# Patient Record
Sex: Male | Born: 1945 | Race: White | Hispanic: No | Marital: Married | Smoking: Former smoker
Health system: Southern US, Community
[De-identification: ages and names within clinical notes are randomized; demographics above are authoritative.]

## PROBLEM LIST (undated history)

## (undated) DIAGNOSIS — Z87442 Personal history of urinary calculi: Secondary | ICD-10-CM

## (undated) DIAGNOSIS — K589 Irritable bowel syndrome without diarrhea: Secondary | ICD-10-CM

## (undated) DIAGNOSIS — J310 Chronic rhinitis: Secondary | ICD-10-CM

## (undated) DIAGNOSIS — E785 Hyperlipidemia, unspecified: Secondary | ICD-10-CM

## (undated) DIAGNOSIS — J189 Pneumonia, unspecified organism: Secondary | ICD-10-CM

## (undated) DIAGNOSIS — I739 Peripheral vascular disease, unspecified: Secondary | ICD-10-CM

## (undated) DIAGNOSIS — N529 Male erectile dysfunction, unspecified: Secondary | ICD-10-CM

## (undated) DIAGNOSIS — C801 Malignant (primary) neoplasm, unspecified: Secondary | ICD-10-CM

## (undated) DIAGNOSIS — I10 Essential (primary) hypertension: Secondary | ICD-10-CM

## (undated) DIAGNOSIS — K219 Gastro-esophageal reflux disease without esophagitis: Secondary | ICD-10-CM

## (undated) HISTORY — DX: Chronic rhinitis: J31.0

## (undated) HISTORY — DX: Essential (primary) hypertension: I10

## (undated) HISTORY — DX: Male erectile dysfunction, unspecified: N52.9

## (undated) HISTORY — PX: OTHER SURGICAL HISTORY: SHX169

## (undated) HISTORY — DX: Gastro-esophageal reflux disease without esophagitis: K21.9

## (undated) HISTORY — DX: Hyperlipidemia, unspecified: E78.5

## (undated) HISTORY — DX: Peripheral vascular disease, unspecified: I73.9

---

## 1978-01-24 HISTORY — PX: OTHER SURGICAL HISTORY: SHX169

## 1997-06-13 ENCOUNTER — Ambulatory Visit (HOSPITAL_COMMUNITY): Admission: RE | Admit: 1997-06-13 | Discharge: 1997-06-13 | Payer: Self-pay | Admitting: Gastroenterology

## 1998-11-11 ENCOUNTER — Encounter: Payer: Self-pay | Admitting: *Deleted

## 1998-11-11 ENCOUNTER — Encounter: Admission: RE | Admit: 1998-11-11 | Discharge: 1998-11-11 | Payer: Self-pay | Admitting: *Deleted

## 1998-11-19 ENCOUNTER — Encounter: Admission: RE | Admit: 1998-11-19 | Discharge: 1998-11-19 | Payer: Self-pay | Admitting: *Deleted

## 1998-11-19 ENCOUNTER — Encounter: Payer: Self-pay | Admitting: *Deleted

## 1999-04-21 ENCOUNTER — Encounter: Admission: RE | Admit: 1999-04-21 | Discharge: 1999-04-21 | Payer: Self-pay | Admitting: Urology

## 1999-04-21 ENCOUNTER — Encounter: Payer: Self-pay | Admitting: Urology

## 1999-05-24 ENCOUNTER — Other Ambulatory Visit: Admission: RE | Admit: 1999-05-24 | Discharge: 1999-05-24 | Payer: Self-pay

## 1999-08-23 ENCOUNTER — Other Ambulatory Visit: Admission: RE | Admit: 1999-08-23 | Discharge: 1999-08-23 | Payer: Self-pay

## 2001-05-28 ENCOUNTER — Encounter: Admission: RE | Admit: 2001-05-28 | Discharge: 2001-05-28 | Payer: Self-pay | Admitting: Gastroenterology

## 2001-05-28 ENCOUNTER — Encounter: Payer: Self-pay | Admitting: Gastroenterology

## 2003-03-14 ENCOUNTER — Encounter: Admission: RE | Admit: 2003-03-14 | Discharge: 2003-03-14 | Payer: Self-pay | Admitting: Family Medicine

## 2004-01-25 HISTORY — PX: PROSTATECTOMY: SHX69

## 2004-03-30 ENCOUNTER — Ambulatory Visit: Admission: RE | Admit: 2004-03-30 | Discharge: 2004-05-05 | Payer: Self-pay | Admitting: Radiation Oncology

## 2004-05-03 ENCOUNTER — Encounter (INDEPENDENT_AMBULATORY_CARE_PROVIDER_SITE_OTHER): Payer: Self-pay | Admitting: Specialist

## 2004-05-03 ENCOUNTER — Inpatient Hospital Stay (HOSPITAL_COMMUNITY): Admission: RE | Admit: 2004-05-03 | Discharge: 2004-05-06 | Payer: Self-pay | Admitting: Urology

## 2005-05-05 ENCOUNTER — Encounter: Admission: RE | Admit: 2005-05-05 | Discharge: 2005-05-05 | Payer: Self-pay | Admitting: Family Medicine

## 2005-06-10 ENCOUNTER — Encounter: Admission: RE | Admit: 2005-06-10 | Discharge: 2005-06-10 | Payer: Self-pay | Admitting: Family Medicine

## 2006-02-27 ENCOUNTER — Encounter: Admission: RE | Admit: 2006-02-27 | Discharge: 2006-04-20 | Payer: Self-pay | Admitting: Neurology

## 2006-03-13 ENCOUNTER — Ambulatory Visit (HOSPITAL_COMMUNITY): Admission: RE | Admit: 2006-03-13 | Discharge: 2006-03-13 | Payer: Self-pay | Admitting: Urology

## 2006-05-10 ENCOUNTER — Encounter: Admission: RE | Admit: 2006-05-10 | Discharge: 2006-08-08 | Payer: Self-pay | Admitting: Neurology

## 2007-06-18 ENCOUNTER — Emergency Department (HOSPITAL_COMMUNITY): Admission: EM | Admit: 2007-06-18 | Discharge: 2007-06-18 | Payer: Self-pay | Admitting: Emergency Medicine

## 2007-09-18 ENCOUNTER — Inpatient Hospital Stay (HOSPITAL_COMMUNITY): Admission: RE | Admit: 2007-09-18 | Discharge: 2007-09-19 | Payer: Self-pay | Admitting: *Deleted

## 2007-09-18 ENCOUNTER — Ambulatory Visit: Payer: Self-pay | Admitting: *Deleted

## 2007-09-18 ENCOUNTER — Emergency Department (HOSPITAL_COMMUNITY): Admission: EM | Admit: 2007-09-18 | Discharge: 2007-09-18 | Payer: Self-pay | Admitting: Emergency Medicine

## 2007-09-20 ENCOUNTER — Other Ambulatory Visit (HOSPITAL_COMMUNITY): Admission: RE | Admit: 2007-09-20 | Discharge: 2007-10-17 | Payer: Self-pay | Admitting: Psychiatry

## 2007-09-27 ENCOUNTER — Ambulatory Visit: Payer: Self-pay | Admitting: Psychiatry

## 2007-11-19 ENCOUNTER — Other Ambulatory Visit (HOSPITAL_COMMUNITY): Admission: RE | Admit: 2007-11-19 | Discharge: 2008-02-17 | Payer: Self-pay | Admitting: Psychiatry

## 2007-11-22 ENCOUNTER — Ambulatory Visit: Payer: Self-pay | Admitting: Psychiatry

## 2008-02-18 ENCOUNTER — Other Ambulatory Visit (HOSPITAL_COMMUNITY): Admission: RE | Admit: 2008-02-18 | Discharge: 2008-03-27 | Payer: Self-pay | Admitting: Psychiatry

## 2010-02-14 ENCOUNTER — Encounter: Payer: Self-pay | Admitting: Family Medicine

## 2010-05-10 LAB — URINE DRUGS OF ABUSE SCREEN W ALC, ROUTINE (REF LAB)
Amphetamine Screen, Ur: NEGATIVE
Barbiturate Quant, Ur: NEGATIVE
Cocaine Metabolites: NEGATIVE
Cocaine Metabolites: NEGATIVE
Creatinine,U: 30.9 mg/dL
Creatinine,U: 49 mg/dL
Ethyl Alcohol: <10 mg/dL (ref <10)
Ethyl Alcohol: <10 mg/dL (ref <10)
Marijuana Metabolite: NEGATIVE
Opiate Screen, Urine: NEGATIVE
Phencyclidine (PCP): NEGATIVE
Propoxyphene: NEGATIVE

## 2010-06-08 NOTE — Discharge Summary (Signed)
[name], [name]                ACCOUNT NO.:  [id number]   MEDICAL RECORD NO.:  [id number]          PATIENT TYPE:  IPS   LOCATION:  0307                          FACILITY:  [facility]   PHYSICIAN:  Jasmine Pang, M.D. DATE OF BIRTH:  [birth year]   DATE OF ADMISSION:  09/18/2007  DATE OF DISCHARGE:  09/19/2007                               DISCHARGE SUMMARY   IDENTIFICATION:  This is a 65 year old married white male who was  admitted on September 18, 2007, on a voluntary basis.   HISTORY OF PRESENT ILLNESS:  The patient presented to [facility] ED  requesting detox from alcohol.  His last drink was last night.  The  patient reports he has been drinking for most of his life, but the  problems had become more since he has been out of work in May.  The  patient states he is drinking approximately 14 to 16 beers a day.  He  has been going to a psychologist and realizes this is a problem.  He  presents to the ED today to enter an alcohol detox.  He denies suicidal  or homicidal ideation.  The patient reports increased depression.   PAST PSYCHIATRIC HISTORY:  The patient is currently being seen by Ellis Savage, nurse practitioner at [facility].  She is  treating him with Klonopin 0.5 mg t.i.d. and Pristiq 50 mg p.o. q.day.   MEDICAL HISTORY:  There were no acute physical or medical problems  noted.   DRUG ALLERGIES:  No known drug allergies.   MEDICATIONS:  1. Levsin 3 times daily (unknown dosage).  2. Klonopin 3 times daily 0.5 mg.  3. Pristiq 50 mg daily.   HOSPITAL COURSE:  Upon admission, the patient states he realized he made  a mistake by admitting himself to the hospital.  He had been given the  option of CDIOP versus inpatient and had thought that would be easy for  him to detox in on an inpatient unit.  He realized; however, that his  issues were not severe enough that he needed to be in an inpatient  hospital.  The director of the CDIOP, who came up to see him  and  assessed him.  She felt he would be fine for starting in the chemical  dependence as an intensive outpatient program on tomorrow, which is  September 20, 2007.  The patient's mental status was stable.  His mood was  somewhat depressed and anxious, but less so than upon admission.  Affect  was consistent with mood.  There was no suicidal or homicidal ideation.  No thoughts of self-injurious behavior.  No auditory or visual  hallucinations.  No paranoia or delusions.  Thoughts logical and goal-  directed.  Thought content.  No predominant theme.  Cognitive was  grossly intact.  Judgment was intact.  Insight was good.  The patient  was started on Librium detox protocol.  This was felt this could be  completed on an outpatient basis.  It was felt he was safe to go home.  His wife was contacted and she felt  safe for him to be discharged as  well.  The patient will start the CDIOP program tomorrow (September 20, 2007).   DISCHARGE DIAGNOSES:  Axis I:  Alcohol dependence.  Axis II:  None.  Axis III:  Healthy.  Axis IV:  Moderate (burden of chemical dependence illness, other  psychosocial stressors).  Axis V:  Global assessment of functioning was 50 upon admission.  GAF  was 60 upon discharge.  GAF highest past year was 75.   DISCHARGES PLANS:  There was no specific activity level or dietary  restrictions.   POSTHOSPITAL CARE PLANS:  The patient will start the Chemical Dependency  Intensive Outpatient Program on September 20, 2007.   DISCHARGE MEDICATIONS:  1. Levsin as directed by his primary care doctor.  2. Clonazepam 0.5 mg p.o. t.i.d.  3. Pristiq 50 mg p.o. q.day.      Jasmine Pang, M.D.  Electronically Signed     BHS/MEDQ  D:  09/19/2007  T:  09/19/2007  Job:  [id number]

## 2010-06-11 NOTE — H&P (Signed)
[name], [name]                ACCOUNT NO.:  [id number]   MEDICAL RECORD NO.:  [id number]          PATIENT TYPE:  INP   LOCATION:  0007                         FACILITY:  [facility]   PHYSICIAN:  Valetta Fuller, M.D.  DATE OF BIRTH:  07/01/1945   DATE OF ADMISSION:  05/03/2004  DATE OF DISCHARGE:                                HISTORY & PHYSICAL   CHIEF COMPLAINT:  Clinical stage T1C adenocarcinoma of the prostate.   HISTORY OF PRESENT ILLNESS:  [name] is a 65 year old male.  He was sent  to our office recently because of a mildly elevated PSA.  The patient was  noted to have a slightly elevated PSA of 4.1 within the past couple of  months.  Two years prior to that, his PSA was 3.2.  He did have a family  history of prostate cancer, and his father did succumb to metastatic  prostate cancer.  The patient had a normal digital rectal exam.  A repeat  PSA was borderline at 4, with a slightly reduced PSA2 reading of 16%.  At  biopsy, the patient had what appeared to be very low-volume Gleason 6 tumor.  The patient underwent extensive counseling with regard to treatment options.  He met with radiation oncology, and we discussed at length options of seed  implantation versus radical retropubic prostatectomy.  The patient elected  to undergo a radical retropubic prostatectomy.  He presents now for that  procedure and hopefully for routine postoperative care.  The patient has  fairly normal sexual functioning, no voiding dysfunction at this time.  He  remains completely asymptomatic.   PAST MEDICAL HISTORY:  Relatively unremarkable.  The patient does have a  history of some mild irritable bowel, for which he takes occasional Levsin  and Donnatal.   He has no drug allergies.   The patient has previous tobacco use history, but quit about 15 years ago.   His review of systems is completely negative for any voiding complaints of  any other systemic issues.   EXAMINATION:  GENERAL:  He  is a well-developed, well-nourished male.  VITAL SIGNS:  Blood pressure 160/80; pulse 76; he is afebrile.  NECK:  Shows no JVD.  CHEST:  Clear to auscultation.  HEART:  Regular rate and rhythm.  ABDOMEN:  Soft, nontender, without obvious masses.  GENITOURINARY:  Reveals normal penis and meatus, scrotum, testes, and  adnexal structures.  Prostate is relatively small, without any worrisome  nodules or indurations.  Seminal vesicles were nonpalpable.  EXTREMITIES:  Without edema.   DATA:  CBC and BMET are within normal limits.   ASSESSMENT:  Clinical stage T1C adenocarcinoma of the prostate.  The patient  will undergo pelvic lymph node dissection and radical retropubic  prostatectomy this morning.  He will be admitted for postoperative care.      DSG/MEDQ  D:  05/03/2004  T:  05/03/2004  Job:  [id number]

## 2010-06-11 NOTE — Discharge Summary (Signed)
NAMESERVANDO, KYLLONEN                ACCOUNT NO.:  [id number]   MEDICAL RECORD NO.:  [id number]          PATIENT TYPE:  INP   LOCATION:  0372                         FACILITY:  [facility]   PHYSICIAN:  Valetta Fuller, M.D.  DATE OF BIRTH:  12-Feb-1945   DATE OF ADMISSION:  05/03/2004  DATE OF DISCHARGE:  05/06/2004                                 DISCHARGE SUMMARY   DISCHARGE DIAGNOSES:  Adenocarcinoma of prostate, pathologic stage PT2C.   PROCEDURE PERFORMED:  Bilateral pelvic lymph node dissection and radical  retropubic prostatectomy on May 03, 2004.   HOSPITAL COURSE:  Mr. Quevedo is a 65 year old male. The patient was  diagnosed with adenocarcinoma of the prostate several weeks ago. The patient  elected after extensive consultation to undergo radical retropubic  prostatectomy for management of his disease. The patient had a minimally  elevated PSA of approximately 4.1. The patient appeared to have low volume  Gleason 6 tumor. The surgery itself was uneventful. Final pathology revealed  negative lymph nodes. The patient ended up having both lobes of his prostate  involved with the Gleason 3+4=7 tumor. All margins were negative and there  was no evidence of capsular extension. His final pathology was PT2C.   The patient's postoperative course was relatively straightforward and  without complication. Postoperative day #1, his hemoglobin was 10.7 with a  normal white count and normal renal function. The patient had minimal JP  output throughout the postoperative time frame.  The patient that did have  intermittent fever consistent with atelectasis. By postoperative day three,  the patient was tolerating general diet and had passed gas. His exam was  completely unremarkable. His JP was removed and he was discharged home.   DISPOSITION:  The patient was discharged home with a Foley catheter to a leg  bag. He was given a short course of Cipro as well as some Vicodin to use as  necessary.  He will present to our office in approximately a week for staple  removal and in 10 days for catheter removal.      DSG/MEDQ  D:  05/19/2004  T:  05/19/2004  Job:  [id number]

## 2010-06-11 NOTE — Op Note (Signed)
NAMEFREDDY, [name]                ACCOUNT NO.:  [id number]   MEDICAL RECORD NO.:  [id number]          PATIENT TYPE:  INP   LOCATION:  0007                         [facility]ACILITY:  [facility]   PHYSICIAN:  Valetta Fuller, M.D.  DATE OF BIRTH:  01-01-1946   DATE OF PROCEDURE:  05/03/2004  DATE OF DISCHARGE:                                 OPERATIVE REPORT   PREOPERATIVE DIAGNOSIS:  Clinical stage T1C adenocarcinoma of the prostate.   POSTOPERATIVE DIAGNOSIS:  Clinical stage T1C adenocarcinoma of the prostate.   PROCEDURE PERFORMED:  Pelvic lymph node dissection and radical retropubic  prostatectomy.   SURGEON:  Dr. Isabel Caprice.   ASSISTANT:  Dr. Brunilda Payor.   ANESTHESIA:  General endotracheal.   INDICATIONS:  [name] is a 65 year old male.  He has been followed in our  office because of a borderline PSA and a family history of prostate cancer.  The patient's PSA was borderline at approximately 4 with a slightly reduced  PSA 2 rating.  Biopsies were done which showed a small microscopic foci of  cancer on the left.  Gleason score was felt to be 3 + 3 = 6.  On the right,  there was no evidence of cancer but some glandular atypia.  The patient was  felt to have very low volume and what appeared to be relatively well-  differentiated prostate cancer, although again given the limited amount of  material, it was difficult to say with certainty.  The patient otherwise  enjoys excellent health.  He was told that in general, we favor radical  retropubic prostatectomy for management of early stage prostate cancer in  young healthy patients but again, what appeared to be relatively well-  differentiated and low volume disease, that he certainly was a potential  candidate for radiation therapy including interstitial seed implantation as  a model therapy.  The patient did undergo consultation with Dr. Ballard Russell .  We had extensive cancer conference with [name] and outlined the pros and  cons of the  various treatment approaches.  He eventually decided to go ahead  with radical retropubic prostatectomy.  He appeared to understand the  advantages and disadvantages.  He understood specifically the risks of  incontinence and sexual dysfunction, and we talked about those things in  some detail.  He also understood the other potential complications which  include but are not limited to pulmonary or cardiac complications that could  result in intraoperative or postoperative death, infection, excessive  bleeding, bowel issues, rectal injury, DVT, and pulmonary embolus as well as  the incontinence and impotence issues.  The patient now presents for  surgery.   TECHNIQUE AND FINDINGS:  The patient was brought to the operating room where  he had successful induction of general anesthesia.  He was placed in the  supine position with great care taken to be sure that all extremities were  padded.  He was then prepped and draped in the usual manner.  A Foley  catheter was then inserted sterilely.  A standard low midline incision was  performed and the midline fascia entered.  The retropubic space was easily  bluntly dissected.  An Omnitract retractor was then utilized.  Attention was  first turned towards the lymph node dissection.  We felt that given his  numbers, his chance of having lymph node metastases was extremely remote,  but we went ahead and removed the obturator packing on the right.  Clips and  cautery were used for small lymphatic channels and small veins.  There were  no palpable nodes.  The same thing was done on the contralateral side.   Attention was then turned to the prostatectomy.  Endopelvic fascia was  opened bilaterally, and the apex of the prostate was easily palpable.  We  were able to appreciate the dorsal vein complex and the groove between the  dorsal vein complex and the urethra.  With some blunt dissective technique,  the puboprostatics were easily visualized and  sharply dissected, freeing up  the apex of the prostate.  We were able to unclasp the right-angle clamp  underneath the dorsal vein complex and above the urethra.  The dorsal vein  complex was then tied with two #1 Vicryl sutures.  A suture ligature was  also placed.  We were then able to transect the dorsal vein complex,  preserving the apex of the prostate.  The underlying ureter was then  identified.  Lateral neurovascular tissue was dissected sharply off the  urethra.  The urethra was then encircled and transected anteriorly exposing  the underlying Foley catheter.  The catheter was then brought out the  incision, exposing the posterior wall.  The urethra was also transected  close to the prostate, leaving a nice urethral stump.  There was some rectal  urethralis tissue which was sharply dissected and again, great care was  taken to the apex of the prostate to make sure that we did not injure the  neurovascular bundles.  Endopelvic fascia was then opened up laterally,  dropping down the bundles on both sides of the lateral aspect of the  prostate.  The rectal plane appeared to be relatively normal.  Posteriorly,  we were able to identify the seminal vesicles which then guided [place] coming  across the pedicles of the prostate which were taken primarily with clips.  In the midline, the vas were identified, separated from the seminal  vesicles, and clipped.  The seminal vesicles were left in place at that  time.   Attention was then turned to the bladder neck.  Using a combination of sharp  and blunt dissection technique, we were able to sharply dissect the prostate  off the bladder neck, for the most part preserving circular fibers.  We did  have a slight increased opening in the bladder neck posteriorly.  The planes  between the bladder neck and the prostate were otherwise easily established.  At that point, we only had the seminal vesicles to deal with which were then transected at their  tips with right angle Hem-o-lok clips, and the entire  specimen was removed.  At that point, the bladder neck accepted  approximately the size of my thumb and therefore needed to be narrowed.  In  the midline, we were able to bring the tissues back to reapproximate the  bladder neck to a slight degree.  Ureteral orifices could be seen well away  from the bladder neck.  The mucosa of the bladder neck was then everted with  some interrupted 4-0 Vicryl suture.  The pelvis was then copiously  irrigated.  The urethral stump was  identified, utilizing the [facility]  retractor, and five sutures were placed with 2-0 Vicryl suture at  corresponding anatomic positions, two posteriorly, two laterally, and one  directly anteriorly.  The needles were then placed in the corresponding  positions at the bladder neck, and a 22-French Foley catheter was inserted.  The anastomosis was completed by tying the Vicryl sutures.  The bladder was  then irrigated, and the urine was clear without obvious leakage, and there  appeared be a nice watertight anastomosis.  The pelvis was again copiously  irrigated.  A separate drain through a stab incision was placed into the  retropubic space.  Sponge and needle counts were correct.  For that reason,  we went ahead and closed the fascia with two #1 PDS sutures in a running  manner, which were then tied together.  Some Marcaine was used to infiltrate  the subcutaneous tissue, and the skin was closed with clips.  The patient  appeared to tolerate the procedure well.  Again, sponge and needle counts  were correct.  The patient was brought to recovery room in stable condition,  and no obvious complications occurred.      DSG/MEDQ  D:  05/03/2004  T:  05/03/2004  Job:  [id number]

## 2010-10-20 LAB — URINALYSIS, ROUTINE W REFLEX MICROSCOPIC
Bilirubin Urine: NEGATIVE
Glucose, UA: NEGATIVE
Nitrite: NEGATIVE
Protein, ur: NEGATIVE
pH: 7.5

## 2010-10-20 LAB — URINE CULTURE

## 2010-10-20 LAB — URINE MICROSCOPIC-ADD ON

## 2010-10-25 LAB — URINE DRUGS OF ABUSE SCREEN W ALC, ROUTINE (REF LAB)
Barbiturate Quant, Ur: NEGATIVE
Ethyl Alcohol: <10
Marijuana Metabolite: NEGATIVE
Methadone: NEGATIVE
Phencyclidine (PCP): NEGATIVE

## 2010-10-26 LAB — URINE DRUGS OF ABUSE SCREEN W ALC, ROUTINE (REF LAB)
Amphetamine Screen, Ur: NEGATIVE
Amphetamine Screen, Ur: NEGATIVE
Amphetamine Screen, Ur: NEGATIVE
Barbiturate Quant, Ur: NEGATIVE
Barbiturate Quant, Ur: NEGATIVE
Benzodiazepines.: NEGATIVE
Cocaine Metabolites: NEGATIVE
Cocaine Metabolites: NEGATIVE
Creatinine,U: 107.6 mg/dL
Creatinine,U: 149.3 mg/dL
Ethyl Alcohol: <10 mg/dL (ref <10)
Ethyl Alcohol: <10 mg/dL (ref <10)
Marijuana Metabolite: NEGATIVE
Marijuana Metabolite: NEGATIVE
Methadone: NEGATIVE
Methadone: NEGATIVE
Opiate Screen, Urine: NEGATIVE
Phencyclidine (PCP): NEGATIVE
Phencyclidine (PCP): NEGATIVE
Phencyclidine (PCP): NEGATIVE
Propoxyphene: NEGATIVE
Propoxyphene: NEGATIVE

## 2010-10-27 LAB — URINE DRUGS OF ABUSE SCREEN W ALC, ROUTINE (REF LAB)
Amphetamine Screen, Ur: NEGATIVE
Amphetamine Screen, Ur: NEGATIVE
Benzodiazepines.: POSITIVE — AB
Benzodiazepines.: POSITIVE — AB
Cocaine Metabolites: NEGATIVE
Cocaine Metabolites: NEGATIVE
Creatinine,U: 162
Creatinine,U: 86
Ethyl Alcohol: <10
Ethyl Alcohol: <10
Marijuana Metabolite: NEGATIVE
Methadone: NEGATIVE
Methadone: NEGATIVE
Opiate Screen, Urine: NEGATIVE
Phencyclidine (PCP): NEGATIVE
Phencyclidine (PCP): NEGATIVE
Propoxyphene: NEGATIVE
Propoxyphene: NEGATIVE

## 2010-10-27 LAB — BENZODIAZEPINE, QUANTITATIVE, URINE
Alprazolam (GC/LC/MS), ur confirm: NEGATIVE
Nordiazepam GC/MS Conf: NEGATIVE
Nordiazepam GC/MS Conf: NEGATIVE
Nordiazepam GC/MS Conf: NEGATIVE
Oxazepam GC/MS Conf: 1100 ng/mL
Oxazepam GC/MS Conf: 810 ng/mL

## 2010-10-29 LAB — URINE DRUGS OF ABUSE SCREEN W ALC, ROUTINE (REF LAB)
Amphetamine Screen, Ur: NEGATIVE
Amphetamine Screen, Ur: NEGATIVE
Amphetamine Screen, Ur: NEGATIVE
Amphetamine Screen, Ur: NEGATIVE
Barbiturate Quant, Ur: NEGATIVE
Benzodiazepines.: NEGATIVE
Benzodiazepines.: NEGATIVE
Benzodiazepines.: NEGATIVE
Cocaine Metabolites: NEGATIVE
Cocaine Metabolites: NEGATIVE
Cocaine Metabolites: NEGATIVE
Cocaine Metabolites: NEGATIVE
Creatinine,U: 77.9 mg/dL
Creatinine,U: 78.8 mg/dL
Ethyl Alcohol: <10 mg/dL (ref <10)
Ethyl Alcohol: <10 mg/dL (ref <10)
Ethyl Alcohol: <10 mg/dL (ref <10)
Ethyl Alcohol: <10 mg/dL (ref <10)
Marijuana Metabolite: NEGATIVE
Marijuana Metabolite: NEGATIVE
Marijuana Metabolite: NEGATIVE
Marijuana Metabolite: NEGATIVE
Methadone: NEGATIVE
Methadone: NEGATIVE
Opiate Screen, Urine: NEGATIVE
Opiate Screen, Urine: NEGATIVE
Opiate Screen, Urine: NEGATIVE
Propoxyphene: NEGATIVE
Propoxyphene: NEGATIVE
Propoxyphene: NEGATIVE
Propoxyphene: NEGATIVE

## 2011-06-27 ENCOUNTER — Other Ambulatory Visit: Payer: Self-pay | Admitting: Orthopedic Surgery

## 2011-06-27 MED ORDER — BUPIVACAINE LIPOSOME 1.3 % IJ SUSP
20.0000 mL | Freq: Once | INTRAMUSCULAR | Status: DC
Start: 2011-06-27 — End: 2011-06-27

## 2011-06-27 MED ORDER — DEXAMETHASONE SODIUM PHOSPHATE 10 MG/ML IJ SOLN: 10.0000 mg | Freq: Once | INTRAMUSCULAR | Status: DC

## 2011-06-27 NOTE — Progress Notes (Signed)
Preoperative surgical orders have been place into the Epic hospital system for [name] on 06/27/2011, 10:41 AM  by Patrica Duel for surgery on 07/06/2011.  Preop Total Hip orders including Experel Injecion, IV Tylenol, and IV Decadron as long as there are no contraindications to the above medications. Avel Peace, PA-C

## 2011-06-27 NOTE — H&P (Signed)
[name] DOB: 07-19-1945  Chief complaint: left hip pain  History of Present Illness The patient is a 66 year old male who comes in today for a preoperative History and Physical. The patient is scheduled for a conversion from a left hip bipolar hemiarthroplasty to a left total hip arthroplasty to be performed by Dr. Gus Bailey. [name], MD at [facility] on Wednesday July 06, 2011 . 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 This was done via the [employer]. He states he never really had full pain relief with that. He does have difficulty walking any distances. This has all gotten worse over the past couple of years. 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 It has not affected his ability to work at all. He states that when he has pain, it's mainly in the buttock and lateral hip and occasionally in the groin. He is not having any lower extremity weakness or paresthesias with this. Due to failure of the bipolar hemiarthroplasty, most predictable means for increased function and decreased pain in the left hip is a conversion to a left total hip arthroplasty. PCP: [name] Cameron Sprang, [place]) Urology: Dr. Barron Alvine     [facility] INTRN ORTH DEV/IMPLANT/GRAFT NEC [phone]) Irritable bowel syndrome Kidney Stone Prostate Cancer. 2006 Diverticulitis Of Colon Gout   Allergies No Known Drug Allergies. 02/25/2011   Family History Father. deceased age 51 due to prostate cancer Mother. deceased age 56 due to complications of sepsis   Social History Drug/Alcohol Rehab (Previously). no Exercise. Exercises rarely Illicit drug use. no Children. 3 Current work status. working full time Drug/Alcohol Rehab (Currently). no Living situation. live with spouse Tobacco / smoke exposure.  no Tobacco use. former smoker; smoke(d) 2 pack(s) per day; quit 26-27 years ago Marital status. married Number of flights of stairs before winded. 2-3 Pain Contract. no Alcohol use. former drinker Post-Surgical Plans. 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 living will Steps. 2 entering the house; 16 to the upstairs   Medication History Aspirin EC (81MG  Tablet DR, Oral daily) Active. (reports that he does no take everyday though) Levsin (0.125MG  Tablet, Oral as needed) Active. Ketoconazole (2% Cream, External as needed) Active.   Past Surgical History Straighten Nasal Septum Total Hip Replacement. left bipolar hemiarthroplasty 1980 Colon Polyp Removal - Colonoscopy Foot Surgery. bilateral bunionectomy 2001 Prostatectomy; Abdominal   Review of Systems General:Not Present- Chills, Fever, Night Sweats, Fatigue, Weight Gain, Weight Loss and Memory Loss. Skin:Present- Lesions. Not Present- Hives, Itching, Rash and Eczema. HEENT:Not Present- Tinnitus, Headache, Double Vision, Visual Loss, Hearing Loss and Dentures. Respiratory:Present- Cough. Not Present- Shortness of breath with exertion, Shortness of breath at rest, Allergies, Coughing up blood and Chronic Cough. Cardiovascular:Not Present- Chest Pain, Racing/skipping heartbeats, Difficulty Breathing Lying Down, Murmur, Swelling and Palpitations. Gastrointestinal:Not Present- Bloody Stool, Heartburn, Abdominal Pain, Vomiting, Nausea, Constipation, Diarrhea, Difficulty Swallowing, Jaundice and Loss of appetitie. Male Genitourinary:Present- Hematuria (chronically). Not Present- Urinary frequency, Blood in Urine, Weak urinary stream, Discharge, Flank Pain, Incontinence, Painful Urination, Urgency, Urinary Retention and Urinating at Night. Musculoskeletal:Present- Joint Pain. Not Present- Muscle Weakness, Muscle Pain, Joint Swelling, Back Pain, Morning Stiffness and Spasms. Neurological:Not Present- Tremor,  Dizziness, Blackout spells, Paralysis, Difficulty with balance and Weakness. Psychiatric:Not Present- Insomnia.   Vitals Weight: 170 lb Height: 68 in Body Surface Area: 1.92 m Body Mass Index: 25.85 kg/m Pulse: 76 (  Regular) Resp.: 16 (Unlabored) BP: 134/78 (Sitting, Left Arm, Standard)    Physical Exam General Mental Status - Alert, cooperative and good historian. General Appearance- pleasant. Not in acute distress. Orientation- Oriented X3. Build & Nutrition- Well nourished and Well developed. Head and Neck Head- normocephalic, atraumatic . Neck Global Assessment- supple. no bruit auscultated on the right and no bruit auscultated on the left. Eye Pupil- Bilateral- Normal. Motion- Bilateral- EOMI. Chest and Lung Exam Auscultation: Breath sounds:- clear at anterior chest wall and - clear at posterior chest wall. Adventitious sounds:- No Adventitious sounds. Cardiovascular Auscultation:Rhythm- Regular rate and rhythm. Heart Sounds- S1 WNL and S2 WNL. Murmurs & Other Heart Sounds:Auscultation of the heart reveals - No Murmurs. Abdomen Palpation/Percussion:Tenderness- Abdomen is non-tender to palpation. Rigidity (guarding)- Abdomen is soft. Auscultation:Auscultation of the abdomen reveals - Bowel sounds normal. Male Genitourinary Not done, not pertinent to present illness Peripheral Vascular Upper Extremity: Palpation:- Pulses bilaterally normal. Lower Extremity: Palpation:- Pulses bilaterally normal. Neurologic Examination of related systems reveals - normal muscle strength and tone in all extremities. Neurologic evaluation reveals - normal sensation and upper and lower extremity deep tendon reflexes intact bilaterally . Musculoskeletal Evaluation of his right hip shows normal ROM and no discomfort. Left hip flexion about 90, minimal internal rotation, and about 20 external rotation and 20-30 abduction. He does not have  pain on ROM of the hip. There is no trochanteric tenderness. Knee exam is normal. Gait pattern is minimally antalgic.  RADIOGRAPHS: AP pelvis and lateral of the hip show a cemented Thompson Unipolar prosthesis. He does have significant erosion of his acetabulum. He is not in protrusio yet but is close. He does not have any definitive loosening of the femoral stem.   Assessment & Plan [facility] CMPL INTRN ORTH DEV/IMPLANT/GRAFT NEC (996.49) Conversion from a left hip bipolar hemiarthroplasty to a left total hip arthroplasty      Dimitri Ped, PA-C

## 2011-06-28 ENCOUNTER — Encounter (HOSPITAL_COMMUNITY): Payer: Self-pay | Admitting: Pharmacy Technician

## 2011-06-28 ENCOUNTER — Encounter (HOSPITAL_COMMUNITY)
Admission: RE | Admit: 2011-06-28 | Discharge: 2011-06-28 | Disposition: A | Discharge status: Home / Self Care | Payer: BC Managed Care – PPO | Source: Ambulatory Visit | Attending: Orthopedic Surgery | Admitting: Orthopedic Surgery

## 2011-06-28 ENCOUNTER — Ambulatory Visit (HOSPITAL_COMMUNITY)
Admission: RE | Admit: 2011-06-28 | Discharge: 2011-06-28 | Disposition: A | Discharge status: Home / Self Care | Payer: BC Managed Care – PPO | Source: Ambulatory Visit | Attending: Orthopedic Surgery | Admitting: Orthopedic Surgery

## 2011-06-28 ENCOUNTER — Encounter (HOSPITAL_COMMUNITY): Payer: Self-pay

## 2011-06-28 DIAGNOSIS — Z01812 Encounter for preprocedural laboratory examination: Secondary | ICD-10-CM | POA: Insufficient documentation

## 2011-06-28 DIAGNOSIS — Z01818 Encounter for other preprocedural examination: Secondary | ICD-10-CM | POA: Insufficient documentation

## 2011-06-28 HISTORY — DX: Malignant (primary) neoplasm, unspecified: C80.1

## 2011-06-28 HISTORY — DX: Irritable bowel syndrome, unspecified: K58.9

## 2011-06-28 LAB — URINALYSIS, ROUTINE W REFLEX MICROSCOPIC
Glucose, UA: NEGATIVE mg/dL
Leukocytes, UA: NEGATIVE
Nitrite: NEGATIVE
Specific Gravity, Urine: 1.012 (ref 1.005–1.030)
pH: 7 (ref 5.0–8.0)

## 2011-06-28 LAB — CBC
MCH: 31.8 pg (ref 26.0–34.0)
MCV: 91.6 fL (ref 78.0–100.0)
Platelets: 214 10*3/uL (ref 150–400)
RDW: 12.5 % (ref 11.5–15.5)
WBC: 4.4 10*3/uL (ref 4.0–10.5)

## 2011-06-28 LAB — COMPREHENSIVE METABOLIC PANEL
ALT: 10 U/L (ref 0–53)
AST: 19 U/L (ref 0–37)
Albumin: 3.8 g/dL (ref 3.5–5.2)
Calcium: 9.1 mg/dL (ref 8.4–10.5)
Creatinine, Ser: 1.06 mg/dL (ref 0.50–1.35)
Sodium: 140 mEq/L (ref 135–145)

## 2011-06-28 LAB — SURGICAL PCR SCREEN
MRSA, PCR: NEGATIVE
Staphylococcus aureus: NEGATIVE

## 2011-06-28 LAB — URINE MICROSCOPIC-ADD ON

## 2011-06-28 LAB — PROTIME-INR: INR: 0.99 (ref 0.00–1.49)

## 2011-06-28 NOTE — Pre-Procedure Instructions (Addendum)
ekg 12-2-12012 [facility] on chart MEDICAL CLEARANCE NOTE Cameron Sprang NP ON CHART

## 2011-06-28 NOTE — Patient Instructions (Signed)
20 OVA MEEGAN  06/28/2011   Your procedure is scheduled on:  07-06-2011  Report to [facility] at  0945 AM.  Call this number if you have problems the morning of surgery: [phone]   Remember:   Do not eat food or drink liquids:After Midnight.  .  Take these medicines the morning of surgery with A SIP OF WATER: no meds to take   Do not wear jewelry or make up.  Do not wear lotions, powders, or perfumes.Do not wear deodorant.    Do not bring valuables to the hospital.  Contacts, dentures or bridgework may not be worn into surgery.  Leave suitcase in the car. After surgery it may be brought to your room.  For patients admitted to the hospital, checkout time is 11:00 AM the day of discharge.     Special Instructions: CHG Shower Use Special Wash: 1/2 bottle night before surgery and 1/2 bottle morning of surgery.neck down avoid private area   Please read over the following fact sheets that you were given: MRSA Information, blood fact sheet, incentive spirometer fact sheet  Cain S[facility] pre op nurse phone number [phone], call if needed

## 2011-06-30 NOTE — Pre-Procedure Instructions (Signed)
Faxed micro ua results to dr Lequita Halt. Fax confirmation received and placed on chart

## 2011-07-06 ENCOUNTER — Encounter (HOSPITAL_COMMUNITY): Payer: Self-pay | Admitting: *Deleted

## 2011-07-06 ENCOUNTER — Ambulatory Visit (HOSPITAL_COMMUNITY): Payer: BC Managed Care – PPO

## 2011-07-06 ENCOUNTER — Inpatient Hospital Stay (HOSPITAL_COMMUNITY)
Admission: RE | Admit: 2011-07-06 | Discharge: 2011-07-09 | DRG: 817 | Disposition: A | Discharge status: Home Health | Payer: BC Managed Care – PPO | Source: Ambulatory Visit | Attending: Orthopedic Surgery | Admitting: Orthopedic Surgery

## 2011-07-06 ENCOUNTER — Ambulatory Visit (HOSPITAL_COMMUNITY): Payer: BC Managed Care – PPO | Admitting: *Deleted

## 2011-07-06 ENCOUNTER — Encounter (HOSPITAL_COMMUNITY): Payer: Self-pay | Admitting: Orthopedic Surgery

## 2011-07-06 ENCOUNTER — Encounter (HOSPITAL_COMMUNITY)
Admission: RE | Disposition: A | Discharge status: Home Health | Payer: Self-pay | Source: Ambulatory Visit | Attending: Orthopedic Surgery

## 2011-07-06 DIAGNOSIS — E878 Other disorders of electrolyte and fluid balance, not elsewhere classified: Secondary | ICD-10-CM | POA: Diagnosis present

## 2011-07-06 DIAGNOSIS — Z8719 Personal history of other diseases of the digestive system: Secondary | ICD-10-CM

## 2011-07-06 DIAGNOSIS — T84018A Broken internal joint prosthesis, other site, initial encounter: Secondary | ICD-10-CM

## 2011-07-06 DIAGNOSIS — Z7982 Long term (current) use of aspirin: Secondary | ICD-10-CM

## 2011-07-06 DIAGNOSIS — Y831 Surgical operation with implant of artificial internal device as the cause of abnormal reaction of the patient, or of later complication, without mention of misadventure at the time of the procedure: Secondary | ICD-10-CM | POA: Diagnosis present

## 2011-07-06 DIAGNOSIS — Z79899 Other long term (current) drug therapy: Secondary | ICD-10-CM

## 2011-07-06 DIAGNOSIS — Z8546 Personal history of malignant neoplasm of prostate: Secondary | ICD-10-CM

## 2011-07-06 DIAGNOSIS — Z87442 Personal history of urinary calculi: Secondary | ICD-10-CM

## 2011-07-06 DIAGNOSIS — Z96649 Presence of unspecified artificial hip joint: Secondary | ICD-10-CM

## 2011-07-06 DIAGNOSIS — T84099A Other mechanical complication of unspecified internal joint prosthesis, initial encounter: Principal | ICD-10-CM | POA: Diagnosis present

## 2011-07-06 LAB — TYPE AND SCREEN: ABO/RH(D): O POS

## 2011-07-06 SURGERY — CONVERSION, PREVIOUS HIP SURGERY, TO TOTAL HIP ARTHROPLASTY
Anesthesia: General | Site: Hip | Laterality: Left | Wound class: Clean

## 2011-07-06 MED ORDER — PHENOL 1.4 % MT LIQD: 1.0000 | OROMUCOSAL | Status: DC | PRN

## 2011-07-06 MED ORDER — SODIUM CHLORIDE 0.9 % IV SOLN: INTRAVENOUS | Status: DC

## 2011-07-06 MED ORDER — TEMAZEPAM 15 MG PO CAPS: 15.0000 mg | ORAL_CAPSULE | Freq: Every evening | ORAL | Status: DC | PRN

## 2011-07-06 MED ORDER — KCL IN DEXTROSE-NACL 20-5-0.9 MEQ/L-%-% IV SOLN
INTRAVENOUS | Status: DC
  Administered 2011-07-06 – 2011-07-07 (×2): via INTRAVENOUS
  Filled 2011-07-06 (×3): qty 1000

## 2011-07-06 MED ORDER — ACETAMINOPHEN 10 MG/ML IV SOLN
INTRAVENOUS | Status: DC | PRN
  Administered 2011-07-06: 1000 mg via INTRAVENOUS

## 2011-07-06 MED ORDER — FLEET ENEMA 7-19 GM/118ML RE ENEM: 1.0000 | ENEMA | Freq: Once | RECTAL | Status: AC | PRN

## 2011-07-06 MED ORDER — OXYCODONE HCL 5 MG PO TABS
5.0000 mg | ORAL_TABLET | ORAL | Status: DC | PRN
  Administered 2011-07-06: 10 mg via ORAL
  Administered 2011-07-06: 5 mg via ORAL
  Administered 2011-07-07 – 2011-07-08 (×8): 10 mg via ORAL
  Filled 2011-07-06 (×8): qty 2
  Filled 2011-07-06: qty 1
  Filled 2011-07-06: qty 2

## 2011-07-06 MED ORDER — BUPIVACAINE LIPOSOME 1.3 % IJ SUSP
20.0000 mL | Freq: Once | INTRAMUSCULAR | Status: AC
  Administered 2011-07-06: 20 mL
  Filled 2011-07-06: qty 20

## 2011-07-06 MED ORDER — GLYCOPYRROLATE 0.2 MG/ML IJ SOLN
INTRAMUSCULAR | Status: DC | PRN
  Administered 2011-07-06: 0.4 mg via INTRAVENOUS

## 2011-07-06 MED ORDER — ACETAMINOPHEN 650 MG RE SUPP: 650.0000 mg | Freq: Four times a day (QID) | RECTAL | Status: DC | PRN

## 2011-07-06 MED ORDER — HYOSCYAMINE SULFATE 0.125 MG SL SUBL
0.1250 mg | SUBLINGUAL_TABLET | SUBLINGUAL | Status: DC | PRN
  Filled 2011-07-06: qty 1

## 2011-07-06 MED ORDER — RIVAROXABAN 10 MG PO TABS
10.0000 mg | ORAL_TABLET | Freq: Every day | ORAL | Status: DC
  Administered 2011-07-07 – 2011-07-09 (×3): 10 mg via ORAL
  Filled 2011-07-06 (×4): qty 1

## 2011-07-06 MED ORDER — MIDAZOLAM HCL 5 MG/5ML IJ SOLN
INTRAMUSCULAR | Status: DC | PRN
  Administered 2011-07-06: 2 mg via INTRAVENOUS

## 2011-07-06 MED ORDER — CEFAZOLIN SODIUM-DEXTROSE 2-3 GM-% IV SOLR
INTRAVENOUS | Status: AC
  Filled 2011-07-06: qty 50

## 2011-07-06 MED ORDER — EPHEDRINE SULFATE 50 MG/ML IJ SOLN
INTRAMUSCULAR | Status: DC | PRN
  Administered 2011-07-06 (×4): 5 mg via INTRAVENOUS

## 2011-07-06 MED ORDER — POLYETHYLENE GLYCOL 3350 17 G PO PACK: 17.0000 g | PACK | Freq: Every day | ORAL | Status: DC | PRN

## 2011-07-06 MED ORDER — DOCUSATE SODIUM 100 MG PO CAPS
100.0000 mg | ORAL_CAPSULE | Freq: Two times a day (BID) | ORAL | Status: DC
  Administered 2011-07-06 – 2011-07-09 (×6): 100 mg via ORAL

## 2011-07-06 MED ORDER — ACETAMINOPHEN 325 MG PO TABS
650.0000 mg | ORAL_TABLET | Freq: Four times a day (QID) | ORAL | Status: DC | PRN
  Administered 2011-07-08: 650 mg via ORAL
  Filled 2011-07-06: qty 2

## 2011-07-06 MED ORDER — DIPHENHYDRAMINE HCL 12.5 MG/5ML PO ELIX: 12.5000 mg | ORAL_SOLUTION | ORAL | Status: DC | PRN

## 2011-07-06 MED ORDER — CEFAZOLIN SODIUM 1-5 GM-% IV SOLN
1.0000 g | Freq: Four times a day (QID) | INTRAVENOUS | Status: AC
  Administered 2011-07-06 – 2011-07-07 (×3): 1 g via INTRAVENOUS
  Filled 2011-07-06 (×3): qty 50

## 2011-07-06 MED ORDER — ONDANSETRON HCL 4 MG PO TABS: 4.0000 mg | ORAL_TABLET | Freq: Four times a day (QID) | ORAL | Status: DC | PRN

## 2011-07-06 MED ORDER — METHOCARBAMOL 500 MG PO TABS
500.0000 mg | ORAL_TABLET | Freq: Four times a day (QID) | ORAL | Status: DC | PRN
  Administered 2011-07-06 – 2011-07-08 (×5): 500 mg via ORAL
  Filled 2011-07-06 (×5): qty 1

## 2011-07-06 MED ORDER — DEXAMETHASONE SODIUM PHOSPHATE 4 MG/ML IJ SOLN
INTRAMUSCULAR | Status: DC | PRN
  Administered 2011-07-06: 10 mg via INTRAVENOUS

## 2011-07-06 MED ORDER — METOCLOPRAMIDE HCL 5 MG/ML IJ SOLN: 5.0000 mg | Freq: Three times a day (TID) | INTRAMUSCULAR | Status: DC | PRN

## 2011-07-06 MED ORDER — LIDOCAINE HCL (CARDIAC) 20 MG/ML IV SOLN
INTRAVENOUS | Status: DC | PRN
  Administered 2011-07-06: 50 mg via INTRAVENOUS

## 2011-07-06 MED ORDER — MENTHOL 3 MG MT LOZG: 1.0000 | LOZENGE | OROMUCOSAL | Status: DC | PRN

## 2011-07-06 MED ORDER — ONDANSETRON HCL 4 MG/2ML IJ SOLN
4.0000 mg | Freq: Four times a day (QID) | INTRAMUSCULAR | Status: DC | PRN
  Administered 2011-07-06 – 2011-07-07 (×2): 4 mg via INTRAVENOUS
  Filled 2011-07-06 (×2): qty 2

## 2011-07-06 MED ORDER — HYDROMORPHONE HCL PF 1 MG/ML IJ SOLN
INTRAMUSCULAR | Status: AC
  Filled 2011-07-06: qty 1

## 2011-07-06 MED ORDER — LACTATED RINGERS IV SOLN: INTRAVENOUS | Status: DC

## 2011-07-06 MED ORDER — FENTANYL CITRATE 0.05 MG/ML IJ SOLN
INTRAMUSCULAR | Status: DC | PRN
  Administered 2011-07-06: 50 ug via INTRAVENOUS
  Administered 2011-07-06: 100 ug via INTRAVENOUS
  Administered 2011-07-06: 50 ug via INTRAVENOUS

## 2011-07-06 MED ORDER — ACETAMINOPHEN 10 MG/ML IV SOLN
1000.0000 mg | Freq: Four times a day (QID) | INTRAVENOUS | Status: AC
  Administered 2011-07-06 – 2011-07-07 (×4): 1000 mg via INTRAVENOUS
  Filled 2011-07-06 (×6): qty 100

## 2011-07-06 MED ORDER — HYDROMORPHONE HCL PF 1 MG/ML IJ SOLN
0.2500 mg | INTRAMUSCULAR | Status: DC | PRN
  Administered 2011-07-06: 0.5 mg via INTRAVENOUS

## 2011-07-06 MED ORDER — METHOCARBAMOL 100 MG/ML IJ SOLN
500.0000 mg | Freq: Four times a day (QID) | INTRAMUSCULAR | Status: DC | PRN
  Filled 2011-07-06: qty 5

## 2011-07-06 MED ORDER — ACETAMINOPHEN 10 MG/ML IV SOLN
1000.0000 mg | Freq: Once | INTRAVENOUS | Status: DC
  Filled 2011-07-06: qty 100

## 2011-07-06 MED ORDER — LACTATED RINGERS IV SOLN
INTRAVENOUS | Status: DC
  Administered 2011-07-06: 15:00:00 via INTRAVENOUS
  Administered 2011-07-06: 1000 mL via INTRAVENOUS
  Administered 2011-07-06: 13:00:00 via INTRAVENOUS

## 2011-07-06 MED ORDER — CEFAZOLIN SODIUM-DEXTROSE 2-3 GM-% IV SOLR
2.0000 g | INTRAVENOUS | Status: AC
  Administered 2011-07-06: 2 g via INTRAVENOUS

## 2011-07-06 MED ORDER — ACETAMINOPHEN 10 MG/ML IV SOLN
INTRAVENOUS | Status: AC
  Filled 2011-07-06: qty 100

## 2011-07-06 MED ORDER — MORPHINE SULFATE 2 MG/ML IJ SOLN: 1.0000 mg | INTRAMUSCULAR | Status: DC | PRN

## 2011-07-06 MED ORDER — 0.9 % SODIUM CHLORIDE (POUR BTL) OPTIME
TOPICAL | Status: DC | PRN
  Administered 2011-07-06: 1000 mL

## 2011-07-06 MED ORDER — KETAMINE HCL 10 MG/ML IJ SOLN
INTRAMUSCULAR | Status: DC | PRN
  Administered 2011-07-06 (×4): 10 mg via INTRAVENOUS

## 2011-07-06 MED ORDER — ROCURONIUM BROMIDE 100 MG/10ML IV SOLN
INTRAVENOUS | Status: DC | PRN
  Administered 2011-07-06: 50 mg via INTRAVENOUS

## 2011-07-06 MED ORDER — NEOSTIGMINE METHYLSULFATE 1 MG/ML IJ SOLN
INTRAMUSCULAR | Status: DC | PRN
  Administered 2011-07-06: 3 mg via INTRAVENOUS

## 2011-07-06 MED ORDER — METOCLOPRAMIDE HCL 10 MG PO TABS: 5.0000 mg | ORAL_TABLET | Freq: Three times a day (TID) | ORAL | Status: DC | PRN

## 2011-07-06 MED ORDER — PROPOFOL 10 MG/ML IV EMUL
INTRAVENOUS | Status: DC | PRN
  Administered 2011-07-06: 200 mg via INTRAVENOUS

## 2011-07-06 MED ORDER — BISACODYL 10 MG RE SUPP: 10.0000 mg | Freq: Every day | RECTAL | Status: DC | PRN

## 2011-07-06 SURGICAL SUPPLY — 61 items
BAG SPEC THK2 15X12 ZIP CLS (MISCELLANEOUS) ×1
BAG ZIPLOCK 12X15 (MISCELLANEOUS) ×2 IMPLANT
BIT DRILL 2.8X128 (BIT) ×2 IMPLANT
BLADE EXTENDED COATED 6.5IN (ELECTRODE) ×4 IMPLANT
BLADE SAW SAG 73X25 THK (BLADE)
BLADE SAW SGTL 73X25 THK (BLADE) ×1 IMPLANT
CLOTH BEACON ORANGE TIMEOUT ST (SAFETY) ×2 IMPLANT
CUP ACET PINNACLE SECTR 58MM (Hips) IMPLANT
DECANTER SPIKE VIAL GLASS SM (MISCELLANEOUS) ×2 IMPLANT
DRAPE INCISE IOBAN 66X45 STRL (DRAPES) ×2 IMPLANT
DRAPE LG THREE QUARTER DISP (DRAPES) ×2 IMPLANT
DRAPE ORTHO SPLIT 77X108 STRL (DRAPES) ×4
DRAPE POUCH INSTRU U-SHP 10X18 (DRAPES) ×2 IMPLANT
DRAPE SURG ORHT 6 SPLT 77X108 (DRAPES) ×2 IMPLANT
DRAPE U-SHAPE 47X51 STRL (DRAPES) ×2 IMPLANT
DRSG ADAPTIC 3X8 NADH LF (GAUZE/BANDAGES/DRESSINGS) ×2 IMPLANT
DRSG MEPILEX BORDER 4X4 (GAUZE/BANDAGES/DRESSINGS) ×2 IMPLANT
DRSG MEPILEX BORDER 4X8 (GAUZE/BANDAGES/DRESSINGS) ×2 IMPLANT
DURAPREP 26ML APPLICATOR (WOUND CARE) ×2 IMPLANT
ELECT REM PT RETURN 9FT ADLT (ELECTROSURGICAL) ×2
ELECTRODE REM PT RTRN 9FT ADLT (ELECTROSURGICAL) ×1 IMPLANT
ELIMINATOR HOLE APEX DEPUY (Hips) ×1 IMPLANT
EVACUATOR 1/8 PVC DRAIN (DRAIN) ×2 IMPLANT
FACESHIELD LNG OPTICON STERILE (SAFETY) ×9 IMPLANT
GLOVE BIO SURGEON STRL SZ7.5 (GLOVE) ×2 IMPLANT
GLOVE BIO SURGEON STRL SZ8 (GLOVE) ×3 IMPLANT
GLOVE BIOGEL PI IND STRL 8 (GLOVE) ×2 IMPLANT
GLOVE BIOGEL PI INDICATOR 8 (GLOVE) ×2
GOWN STRL NON-REIN LRG LVL3 (GOWN DISPOSABLE) ×2 IMPLANT
GOWN STRL REIN XL XLG (GOWN DISPOSABLE) ×2 IMPLANT
HEAD CERAMIC DELTA 36 PLUS 1.5 (Hips) ×1 IMPLANT
IMMOBILIZER KNEE 20 (SOFTGOODS) ×2
IMMOBILIZER KNEE 20 THIGH 36 (SOFTGOODS) IMPLANT
KIT BASIN OR (CUSTOM PROCEDURE TRAY) ×2 IMPLANT
LINER MARATHON NEUT +4X58X36 (Hips) ×1 IMPLANT
MANIFOLD NEPTUNE II (INSTRUMENTS) ×2 IMPLANT
NDL SAFETY ECLIPSE 18X1.5 (NEEDLE) ×1 IMPLANT
NEEDLE HYPO 18GX1.5 SHARP (NEEDLE) ×2
NS IRRIG 1000ML POUR BTL (IV SOLUTION) ×2 IMPLANT
PACK TOTAL JOINT (CUSTOM PROCEDURE TRAY) ×2 IMPLANT
PASSER SUT SWANSON 36MM LOOP (INSTRUMENTS) ×2 IMPLANT
PINNACLE SECTOR CUP 58MM (Hips) ×2 IMPLANT
POSITIONER SURGICAL ARM (MISCELLANEOUS) ×2 IMPLANT
SCREW 6.5MMX25MM (Screw) ×2 IMPLANT
SPONGE GAUZE 4X4 12PLY (GAUZE/BANDAGES/DRESSINGS) ×2 IMPLANT
SPONGE LAP 18X18 X RAY DECT (DISPOSABLE) ×3 IMPLANT
STAPLER SKIN PROX WIDE 3.9 (STAPLE) ×1 IMPLANT
STRIP CLOSURE SKIN 1/2X4 (GAUZE/BANDAGES/DRESSINGS) ×4 IMPLANT
SUT ETHIBOND NAB CT1 #1 30IN (SUTURE) ×4 IMPLANT
SUT MNCRL AB 4-0 PS2 18 (SUTURE) ×2 IMPLANT
SUT VIC AB 1 CT1 27 (SUTURE)
SUT VIC AB 1 CT1 27XBRD ANTBC (SUTURE) ×3 IMPLANT
SUT VIC AB 2-0 CT1 27 (SUTURE) ×6
SUT VIC AB 2-0 CT1 TAPERPNT 27 (SUTURE) ×3 IMPLANT
SUT VLOC 180 0 24IN GS25 (SUTURE) ×2 IMPLANT
SYR 50ML LL SCALE MARK (SYRINGE) ×2 IMPLANT
SYS SOLUTION 8IN BOWD 45OFFSET (Hips) ×1 IMPLANT
TOWEL OR 17X26 10 PK STRL BLUE (TOWEL DISPOSABLE) ×4 IMPLANT
TOWEL OR NON WOVEN STRL DISP B (DISPOSABLE) ×2 IMPLANT
TRAY FOLEY CATH 14FRSI W/METER (CATHETERS) ×2 IMPLANT
WATER STERILE IRR 1500ML POUR (IV SOLUTION) ×2 IMPLANT

## 2011-07-06 NOTE — Brief Op Note (Signed)
07/06/2011  3:24 PM  PATIENT:  Sheffield Slider  66 y.o. male  PRE-OPERATIVE DIAGNOSIS:  Painful Left Hip/Unipolar Hemi Arthroplasty  POST-OPERATIVE DIAGNOSIS:  Painful Left Hip/Unipolar Hemi  Arthroplasty  PROCEDURE:  Procedure(s) (LRB): CONVERSION TO TOTAL HIP (Left)  SURGEON:  Surgeon(s) and Role:    * Loanne Drilling, MD - Primary  PHYSICIAN ASSISTANT:   ASSISTANTS: Avel Peace, PA-C   ANESTHESIA:   general  EBL:  Total I/O In: 2000 [I.V.:2000] Out: 625 [Urine:350; Blood:275]  BLOOD ADMINISTERED:none  DRAINS: (Medium) Hemovact drain(s) in the Left thigh with  Suction Open   LOCAL MEDICATIONS USED:  OTHER Exparel  COUNTS:  YES  DICTATION: .Other Dictation: Dictation Number [id number]  PLAN OF CARE: Admit to inpatient   PATIENT DISPOSITION:  PACU - hemodynamically stable.

## 2011-07-06 NOTE — Anesthesia Preprocedure Evaluation (Addendum)
Anesthesia Evaluation  Patient identified by MRN, date of birth, ID band Patient awake    Reviewed: Allergy & Precautions, H&P , NPO status , Patient's Chart, lab work & pertinent test results  Airway Mallampati: II TM Distance: >3 FB Neck ROM: full    Dental  (+) Caps and Dental Advisory Given,    Pulmonary neg pulmonary ROS,  breath sounds clear to auscultation  Pulmonary exam normal       Cardiovascular Exercise Tolerance: Good negative cardio ROS  Rhythm:regular Rate:Normal     Neuro/Psych negative neurological ROS  negative psych ROS   GI/Hepatic negative GI ROS, Neg liver ROS,   Endo/Other  negative endocrine ROS  Renal/GU negative Renal ROS  negative genitourinary   Musculoskeletal   Abdominal   Peds  Hematology negative hematology ROS (+)   Anesthesia Other Findings   Reproductive/Obstetrics negative OB ROS                          Anesthesia Physical Anesthesia Plan  ASA: II  Anesthesia Plan: General   Post-op Pain Management:    Induction: Intravenous  Airway Management Planned: Oral ETT  Additional Equipment:   Intra-op Plan:   Post-operative Plan: Extubation in OR  Informed Consent: I have reviewed the patients History and Physical, chart, labs and discussed the procedure including the risks, benefits and alternatives for the proposed anesthesia with the patient or authorized representative who has indicated his/her understanding and acceptance.   Dental Advisory Given  Plan Discussed with: CRNA and Surgeon  Anesthesia Plan Comments:        Anesthesia Quick Evaluation

## 2011-07-06 NOTE — Anesthesia Postprocedure Evaluation (Signed)
  Anesthesia Post-op Note  Patient: [name]  Procedure(s) Performed: Procedure(s) (LRB): CONVERSION TO TOTAL HIP (Left)  Patient Location: PACU  Anesthesia Type: General  Level of Consciousness: awake and alert   Airway and Oxygen Therapy: Patient Spontanous Breathing  Post-op Pain: mild  Post-op Assessment: Post-op Vital signs reviewed, Patient's Cardiovascular Status Stable, Respiratory Function Stable, Patent Airway and No signs of Nausea or vomiting  Post-op Vital Signs: stable  Complications: No apparent anesthesia complications

## 2011-07-06 NOTE — Interval H&P Note (Signed)
History and Physical Interval Note:  07/06/2011 12:47 PM  [name]  has presented today for surgery, with the diagnosis of Painful Left Hip/Unipolar Hemi Arthroplasty  The various methods of treatment have been discussed with the patient and family. After consideration of risks, benefits and other options for treatment, the patient has consented to  Procedure(s) (LRB): CONVERSION TO TOTAL HIP (Left) as a surgical intervention .  The patients' history has been reviewed, patient examined, no change in status, stable for surgery.  I have reviewed the patients' chart and labs.  Questions were answered to the patient's satisfaction.     Loanne Drilling

## 2011-07-06 NOTE — Transfer of Care (Signed)
Immediate Anesthesia Transfer of Care Note  Patient: [name]  Procedure(s) Performed: Procedure(s) (LRB): CONVERSION TO TOTAL HIP (Left)  Patient Location: PACU  Anesthesia Type: General  Level of Consciousness: awake, alert , oriented and patient cooperative  Airway & Oxygen Therapy: Patient Spontanous Breathing and Patient connected to face mask oxygen  Post-op Assessment: Report given to PACU RN and Post -op Vital signs reviewed and stable  Post vital signs: Reviewed and stable  Complications: No apparent anesthesia complications

## 2011-07-06 NOTE — Preoperative (Signed)
Beta Blockers   Reason not to administer Beta Blockers:Not Applicable, pt not on home BB 

## 2011-07-07 LAB — CBC
HCT: 32.9 % — ABNORMAL LOW (ref 39.0–52.0)
Hemoglobin: 11.6 g/dL — ABNORMAL LOW (ref 13.0–17.0)
RBC: 3.61 MIL/uL — ABNORMAL LOW (ref 4.22–5.81)
WBC: 10.5 10*3/uL (ref 4.0–10.5)

## 2011-07-07 LAB — BASIC METABOLIC PANEL
BUN: 10 mg/dL (ref 6–23)
Chloride: 100 mEq/L (ref 96–112)
GFR calc Af Amer: 90 mL/min — ABNORMAL LOW (ref >90)
Glucose, Bld: 181 mg/dL — ABNORMAL HIGH (ref 70–99)
Potassium: 4.1 mEq/L (ref 3.5–5.1)

## 2011-07-07 LAB — URINE CULTURE: Culture: NO GROWTH

## 2011-07-07 MED ORDER — POLYVINYL ALCOHOL 1.4 % OP SOLN
1.0000 [drp] | OPHTHALMIC | Status: DC | PRN
  Administered 2011-07-07: 1 [drp] via OPHTHALMIC
  Filled 2011-07-07: qty 15

## 2011-07-07 NOTE — Evaluation (Signed)
Physical Therapy Evaluation Patient Details Name: [name] MRN: [id number] DOB: [birth year] Today's Date: 07/07/2011 Time: 6578-4696 PT Time Calculation (min): 40 min  PT Assessment / Plan / Recommendation Clinical Impression  Pt with L THR presents with decreased L LE strength/ROM and limitations in functional mobility    PT Assessment  Patient needs continued PT services    Follow Up Recommendations  Home health PT    Barriers to Discharge        lEquipment Recommendations  Rolling walker with 5" wheels;Hospital bed    Recommendations for Other Services OT consult   Frequency 7X/week    Precautions / Restrictions Precautions Precautions: Posterior Hip Precaution Comments: sign hung in room Restrictions Weight Bearing Restrictions: Yes LLE Weight Bearing: Partial weight bearing LLE Partial Weight Bearing Percentage or Pounds: 25-50%   Pertinent Vitals/Pain 5/10; MEDs requested and received; ice pack provided      Mobility  Bed Mobility Bed Mobility: Supine to Sit Supine to Sit: 4: Min assist Details for Bed Mobility Assistance: cues for sequence and for use of UEs and R LE to self assist Transfers Transfers: Sit to Stand;Stand to Sit Sit to Stand: 4: Min assist Stand to Sit: 4: Min assist Details for Transfer Assistance: cues for use of UEs and for LE management Ambulation/Gait Ambulation/Gait Assistance: 4: Min assist Ambulation Distance (Feet): 32 Feet Assistive device: Rolling walker Ambulation/Gait Assistance Details: cues for sequence, posture, stride length, ER on L and position from RW Gait Pattern: Step-to pattern    Exercises Total Joint Exercises Ankle Circles/Pumps: AROM;15 reps;Supine;Both Quad Sets: AROM;Both;10 reps;Supine Heel Slides: AAROM;Left;10 reps;Supine Hip ABduction/ADduction: AAROM;Left;10 reps;Supine   PT Diagnosis: Difficulty walking  PT Problem List: Decreased strength;Decreased range of motion;Decreased activity  tolerance;Decreased mobility;Decreased knowledge of use of DME;Pain;Decreased knowledge of precautions PT Treatment Interventions: DME instruction;Gait training;Stair training;Functional mobility training;Therapeutic activities;Therapeutic exercise;Patient/family education   PT Goals Acute Rehab PT Goals PT Goal Formulation: With patient Time For Goal Achievement: 07/13/11 Potential to Achieve Goals: Good Pt will go Supine/Side to Sit: with supervision PT Goal: Supine/Side to Sit - Progress: Goal set today Pt will go Sit to Supine/Side: with supervision PT Goal: Sit to Supine/Side - Progress: Goal set today Pt will go Sit to Stand: with supervision PT Goal: Sit to Stand - Progress: Goal set today Pt will go Stand to Sit: with supervision PT Goal: Stand to Sit - Progress: Goal set today Pt will Ambulate: 51 - 150 feet;with supervision;with rolling walker PT Goal: Ambulate - Progress: Goal set today Pt will Go Up / Down Stairs: 1-2 stairs;with min assist;with least restrictive assistive device PT Goal: Up/Down Stairs - Progress: Goal set today  Visit Information  Last PT Received On: 07/07/11 Assistance Needed: +1    Subjective Data  Subjective: I had that half a hip for 30 years Patient Stated Goal: Resume previous lifestyle with decreased pain   Prior Functioning  Home Living Lives With: Spouse Available Help at Discharge: Family Type of Home: House Home Access: Stairs to enter Secretary/administrator of Steps: 2 Entrance Stairs-Rails: None Home Layout: Able to live on main level with bedroom/bathroom (plans hospital bed on main level) Home Adaptive Equipment: None Prior Function Level of Independence: Independent Able to Take Stairs?: Yes Driving: Yes Vocation: Full time employment Communication Communication: No difficulties Dominant Hand: Left    Cognition  Overall Cognitive Status: Appears within functional limits for tasks assessed/performed Arousal/Alertness:  Awake/alert Orientation Level: Appears intact for tasks assessed Behavior During Session: [facility] for  tasks performed    Extremity/Trunk Assessment Right Upper Extremity Assessment RUE ROM/Strength/Tone: [facility] for tasks assessed Left Upper Extremity Assessment LUE ROM/Strength/Tone: WFL for tasks assessed Right Lower Extremity Assessment RLE ROM/Strength/Tone: [facility] for tasks assessed Left Lower Extremity Assessment LLE ROM/Strength/Tone: Deficits LLE ROM/Strength/Tone Deficits: AAROM to 80 hip flex and 20 hip abd wtih 2+/5 hip strength   Balance    End of Session PT - End of Session Activity Tolerance: Patient tolerated treatment well Patient left: in chair;with call bell/phone within reach Nurse Communication: Mobility status   Lianna Sitzmann 07/07/2011, 1:32 PM

## 2011-07-07 NOTE — Op Note (Signed)
NAMECARLTON, BUSKEY                ACCOUNT NO.:  [id number]  MEDICAL RECORD NO.:  [id number]  LOCATION:  1606                         FACILITY:  [facility]  PHYSICIAN:  Ollen Gross, M.D.    DATE OF BIRTH:  04/22/1945  DATE OF PROCEDURE:  07/06/2011 DATE OF DISCHARGE:                              OPERATIVE REPORT   PREOPERATIVE DIAGNOSIS:  Failed left unipolar hemiarthroplasty.  POSTOPERATIVE DIAGNOSIS:  Failed left unipolar hemiarthroplasty.  PROCEDURE:  Conversion of left hip hemiarthroplasty to total hip arthroplasty.  SURGEON:  Ollen Gross, M.D.  ASSISTANT:  Alexzandrew L. Perkins, P.A.C.  ANESTHESIA:  General.  ESTIMATED BLOOD LOSS:  300.  DRAINS:  Hemovac x1.  COMPLICATIONS:  None.  CONDITION:  Stable to Recovery.  BRIEF CLINICAL NOTE:  Mr. Longshore is a 66 year old male who had a left hip unipolar hemiarthroplasty done many years ago.  Recently, he has gone on to developing increased left groin pain and left thigh pain. The radiographs showed probable loosening of the stem and showed definite erosion of the acetabulum.  It has migrated superior and medial.  He presents now for conversion to a total hip arthroplasty, both revising the femoral component and placing an acetabular component.  PROCEDURE IN DETAIL:  After successful administration of general anesthetic, the patient was placed in the right lateral decubitus position with the left side up and held with the hip positioner.  Left lower extremity was then isolated from his perineum with plastic drapes and prepped and draped in the usual sterile fashion.  Standard posterolateral incision was made with a 10 blade through subcutaneous tissue to the fascia lata, which was incised in line with the skin incision.  Sciatic nerve was palpated and protected.  Short rotators and capsule were isolated off the femur.  The hip was dislocated.  He had an old unipolar prosthesis in place consistent with a probable  Bateman unipolar hemiarthroplasty.  This was loose and was rotating within his femur.  I easily removed the stem from the canal.  The femur was then retracted anteriorly to gain acetabular exposure. Acetabular retractors were placed.  Labrum and osteophytes were removed. The acetabulum was quite sclerotic and essentially was reamed itself by the unipolar head that was in there.  I did start reaming at 49-mm coursing increments of 2-57 mm and then a 58-mm Pinnacle acetabular shell was placed in anatomic position and transfixed with 2 dome screws at excellent purchase.  The apex hole eliminator was then placed and the 36-mm neutral +4 Marathon liner placed.  We then addressed the femur.  I carefully removed the cement with the Moreland cement extraction devices.  When I got down to the bottom of the cement mantle, I removed that and then placed a long straight instrument down the canal to confirm that we remained intramedullary and did not perforate.  I then used the flexible thin shaft reamers to ream until I got excellent purchase.  We reamed up to 17-mm with placement of a long bowed 16.5-mm 8- inch solution stem.  We then broached proximally for the body of the stem up to the 16.5.  A trial bowed long left 8- inch solution stem was  placed.  Reversion was about 20 degrees of anteversion.  This was the native position that the stem wanted to rest in.  The trial neck in a 32+ 1.5 head was placed.  Head was reduced with outstanding stability.  There was full extension, full external rotation, 70 degrees flexion, 40 degrees adduction, 90 degrees internal rotation, 90 degrees flexion, and 70 degrees internal rotation.  By placing the left leg on top of the right, I felt the leg-lengths were equal.  Note that the stem was about 5-6 mm apart from the calcar and this was the position it wanted to settle in.  The trial was removed, the knee permanent.  8-inch solution bowed stem 16.5-mm was  impacted into the femoral canal, again rested about 5-mm above the calcar.  The 36 +1.5 head was placed and the hips reduced with the same stability parameters.  AP and lateral x-rays were taken to confirm that the stem was intramedullary and that no fractures have occurred.  Once these were reviewed, then the wound was copiously irrigated with saline solution and the short rotators and capsule reattached to the femur through drill holes with Ethibond suture.  Total of 20 mL of Exparel mixed with 50 mL saline were injected into the short rotators, capsule, gluteal muscles and fascia lata and subcu tissues. Fascia lata was then closed over Hemovac drain with running #1 V-Loc. Subcu was closed in multiple layers with 2-0 Vicryl and subcuticular running 4-0 Monocryl.  Drains hooked to suction, incision cleaned and dried, Steri-Strips and a bulky sterile dressing were applied.  He was then placed into a knee immobilizer, awakened and transported to Recovery in stable condition.  Please note that the head used was a ceramic head.     Ollen Gross, M.D.     FA/MEDQ  D:  07/06/2011  T:  07/07/2011  Job:  [id number]

## 2011-07-07 NOTE — Progress Notes (Signed)
Subjective: 1 Day Post-Op Procedure(s) (LRB): CONVERSION TO TOTAL HIP (Left) Patient reports pain as mild.   Patient seen in rounds with Dr. Lequita Halt. Patient is well, and has had no acute complaints or problems We will start therapy today.  Plan is to go Home after hospital stay.  Objective: Vital signs in last 24 hours: Temp:  [97.1 F (36.2 C)-98.4 F (36.9 C)] 97.7 F (36.5 C) (06/13 0525) Pulse Rate:  [59-89] 60  (06/13 0525) Resp:  [14-18] 16  (06/13 0525) BP: (113-162)/(40-87) 113/64 mmHg (06/13 0525) SpO2:  [96 %-100 %] 99 % (06/13 0525) Weight:  [74.844 kg (165 lb)] 74.844 kg (165 lb) (06/12 1630)  Intake/Output from previous day:  Intake/Output Summary (Last 24 hours) at 07/07/11 0925 Last data filed at 07/07/11 0651  Gross per 24 hour  Intake 4143.5 ml  Output   2060 ml  Net 2083.5 ml    Intake/Output this shift:    Labs:  Basename 07/07/11 0430  HGB 11.6*    Basename 07/07/11 0430  WBC 10.5  RBC 3.61*  HCT 32.9*  PLT 157    Basename 07/07/11 0430  NA 135  K 4.1  CL 100  CO2 27  BUN 10  CREATININE 1.00  GLUCOSE 181*  CALCIUM 8.0*   No results found for this basename: LABPT:2,INR:2 in the last 72 hours  EXAM General - Patient is Alert, Appropriate and Oriented Extremity - Neurovascular intact Sensation intact distally Dorsiflexion/Plantar flexion intact Dressing - dressing C/D/I Motor Function - intact, moving foot and toes well on exam.  Hemovac pulled without difficulty.  Past Medical History  Diagnosis Date  . Irritable bowel   . Cancer     Assessment/Plan: 1 Day Post-Op Procedure(s) (LRB): CONVERSION TO TOTAL HIP (Left) Principal Problem:  *Failed total hip arthroplasty   Advance diet Up with therapy Continue foley due to strict I&O and urinary output monitoring Discharge home with home health  DVT Prophylaxis - Xarelto Partial-Weight Bearing 25-50% left Leg D/C Knee Immobilizer Hemovac Pulled Begin Therapy Hip  Preacutions Keep foley until tomorrow. No vaccines.  Sahas Sluka 07/07/2011, 9:25 AM

## 2011-07-07 NOTE — Progress Notes (Signed)
Physical Therapy Treatment Patient Details Name: NACHMAN SUNDT MRN: [id number] DOB: [birth year] Today's Date: 07/07/2011 Time: 4098-1191 PT Time Calculation (min): 37 min  PT Assessment / Plan / Recommendation Comments on Treatment Session       Follow Up Recommendations  Home health PT    Barriers to Discharge        Equipment Recommendations  Rolling walker with 5" wheels;Hospital bed    Recommendations for Other Services OT consult  Frequency 7X/week   Plan Discharge plan remains appropriate    Precautions / Restrictions Precautions Precautions: Posterior Hip Precaution Comments: No new complaints Restrictions Weight Bearing Restrictions: Yes LLE Weight Bearing: Partial weight bearing LLE Partial Weight Bearing Percentage or Pounds: 25-50%   Pertinent Vitals/Pain 3-4/10; premedicated; ice pack provided    Mobility  Transfers Transfers: Sit to Stand;Stand to Sit Sit to Stand: 4: Min assist Stand to Sit: 4: Min assist Details for Transfer Assistance: cues for use of UEs and for LE management Ambulation/Gait Ambulation/Gait Assistance: 4: Min assist Ambulation Distance (Feet): 122 Feet Assistive device: Rolling walker Ambulation/Gait Assistance Details: cues for sequence, stride length, posture, position from RW and ER on L Gait Pattern: Step-to pattern    Exercises     PT Diagnosis:    PT Problem List:   PT Treatment Interventions:     PT Goals Acute Rehab PT Goals PT Goal Formulation: With patient Time For Goal Achievement: 07/13/11 Potential to Achieve Goals: Good Pt will go Supine/Side to Sit: with supervision PT Goal: Supine/Side to Sit - Progress: Goal set today Pt will go Sit to Supine/Side: with supervision PT Goal: Sit to Supine/Side - Progress: Goal set today Pt will go Sit to Stand: with supervision PT Goal: Sit to Stand - Progress: Progressing toward goal Pt will go Stand to Sit: with supervision PT Goal: Stand to Sit - Progress: Progressing  toward goal Pt will Ambulate: 51 - 150 feet;with supervision;with rolling walker PT Goal: Ambulate - Progress: Progressing toward goal Pt will Go Up / Down Stairs: 1-2 stairs;with min assist;with least restrictive assistive device PT Goal: Up/Down Stairs - Progress: Goal set today Additional Goals PT Goal: Additional Goal #3 - Progress: Goal set today  Visit Information  Last PT Received On: 07/07/11 Assistance Needed: +1    Subjective Data  Subjective: No new complaints Patient Stated Goal: Resume previous lifestyle with decreased pain   Cognition  Overall Cognitive Status: Appears within functional limits for tasks assessed/performed Arousal/Alertness: Awake/alert Orientation Level: Appears intact for tasks assessed Behavior During Session: [facility] for tasks performed    Balance     End of Session PT - End of Session Activity Tolerance: Patient tolerated treatment well Patient left: in chair;with call bell/phone within reach Nurse Communication: Mobility status    Kennisha Qin 07/07/2011, 3:47 PM

## 2011-07-08 LAB — BASIC METABOLIC PANEL
BUN: 11 mg/dL (ref 6–23)
GFR calc non Af Amer: 71 mL/min — ABNORMAL LOW (ref >90)
Glucose, Bld: 118 mg/dL — ABNORMAL HIGH (ref 70–99)
Potassium: 3.6 mEq/L (ref 3.5–5.1)

## 2011-07-08 LAB — CBC
HCT: 31.1 % — ABNORMAL LOW (ref 39.0–52.0)
Hemoglobin: 10.8 g/dL — ABNORMAL LOW (ref 13.0–17.0)
MCH: 32.2 pg (ref 26.0–34.0)
MCHC: 34.7 g/dL (ref 30.0–36.0)

## 2011-07-08 NOTE — Care Management Note (Signed)
    Page 1 of 2   07/08/2011     11:58:33 AM   CARE MANAGEMENT NOTE 07/08/2011  Patient:  [name], [name]   Account Number:  [id number]  Date Initiated:  07/08/2011  Documentation initiated by:  Colleen Can  Subjective/Objective Assessment:   dx failed left hip unipolar hemiarthroplasty; conversion to total hip arthroplasty  [facility] referral from doctor's office     Action/Plan:   CM spoke with pt.  Plans are for patient to go home with spouse as caregiver. Pt states he already has DME arranged for delivery. Wants [facility] for [facility] services. List placed on chart   Anticipated DC Date:  07/09/2011   Anticipated DC Plan:  HOME W HOME HEALTH SERVICES  In-house referral  NA      DC Planning Services  CM consult      [facility] Choice  HOME HEALTH  DURABLE MEDICAL EQUIPMENT   Choice offered to / List presented to:  C-1 Patient   DME arranged  HOSPITAL BED  3-N-1  Levan Hurst      DME agency  [facility]     HH arranged  HH-2 PT      [facility] agency  [facility]   Status of service:  Completed, signed off Medicare Important Message given?  NO (If response is "NO", the following Medicare IM given date fields will be blank) Date Medicare IM given:   Date Additional Medicare IM given:    Discharge Disposition:    Per UR Regulation:  Reviewed for med. necessity/level of care/duration of stay  If discussed at Long Length of Stay Meetings, dates discussed:    Comments:  07/08/2011 Raynelle Bring BSN CCM [phone] Genevieve Norlander will provide HHpt with start date 07/10/2011. [facility] arranged for DME. Anticipate discharge tomorrow.

## 2011-07-08 NOTE — Evaluation (Signed)
Occupational Therapy Evaluation Patient Details Name: OMARIO ANDER MRN: [id number] DOB: 08-07-1945 Today's Date: 07/08/2011 Time: 6578-4696 OT Time Calculation (min): 47 min  OT Assessment / Plan / Recommendation Clinical Impression  Pleasant 66 yr old male admitted for left THA.  Currently at a supervision level for simulated selfcare tasks and ADLS.  Will have PRN assistance from wife at discharge.  Feel he will need 3:1 for home but no further OT needs at this time.    OT Assessment  Patient does not need any further OT services    Follow Up Recommendations  No OT follow up       Equipment Recommendations  3 in 1 bedside comode          Precautions / Restrictions Precautions Precautions: Posterior Hip Restrictions Weight Bearing Restrictions: No LLE Weight Bearing: Partial weight bearing   Pertinent Vitals/Pain 6/10 meds given prior to session.    ADL  Eating/Feeding: Simulated;Independent Where Assessed - Eating/Feeding: Edge of bed Grooming: Performed;Shaving;Wash/dry hands;Wash/dry face;Teeth care;Brushing hair;Supervision/safety Where Assessed - Grooming: Unsupported standing Upper Body Bathing: Simulated;Set up Where Assessed - Upper Body Bathing: Unsupported sitting Lower Body Bathing: Simulated;Supervision/safety Where Assessed - Lower Body Bathing: Unsupported sit to stand Upper Body Dressing: Simulated;Set up Where Assessed - Upper Body Dressing: Unsupported sitting Lower Body Dressing: Simulated;Supervision/safety;Other (comment) (with use of reacher and sockaide.) Where Assessed - Lower Body Dressing: Unsupported sit to stand Toilet Transfer: Research scientist (life sciences) Method: Stand pivot Acupuncturist: Raised toilet seat with arms (or 3-in-1 over toilet) Toileting - Clothing Manipulation and Hygiene: Performed;Supervision/safety Where Assessed - Engineer, mining and Hygiene: Sit to stand from 3-in-1 or  toilet Tub/Shower Transfer Method: Not assessed Equipment Used: Rolling walker;Reacher;Sock aid Transfers/Ambulation Related to ADLs: Pt supervision for mobility using the RW. ADL Comments: Pt able to state 3/3 THR precautions.  Educated on use of AE for LB selfcare and handout provided.  Pt also educated verbally on shower transfers once he is able to negotiate his steps.  Provided walker bag for use with transporting reacher and small items.  No further issues.  Pt's wife can provide PRN supervision.         Visit Information  Last OT Received On: 07/08/11 Assistance Needed: +1    Subjective Data  Subjective: "I'm having a little bit more pain today than yesterday." Patient Stated Goal: Get back to being as independent as possible.   Prior Functioning  Home Living Lives With: Spouse Available Help at Discharge: Family Type of Home: House Home Access: Stairs to enter Secretary/administrator of Steps: 2 Entrance Stairs-Rails: None Home Layout: Able to live on main level with bedroom/bathroom (plans hospital bed on main level) Bathroom Shower/Tub: Walk-in shower (On second floor) Firefighter: Standard Home Adaptive Equipment: None Prior Function Level of Independence: Independent Able to Take Stairs?: Yes Driving: Yes Vocation: Full time employment Communication Communication: No difficulties Dominant Hand: Left    Cognition  Overall Cognitive Status: Appears within functional limits for tasks assessed/performed Arousal/Alertness: Awake/alert Orientation Level: Appears intact for tasks assessed Behavior During Session: [facility] for tasks performed    Extremity/Trunk Assessment Right Upper Extremity Assessment RUE ROM/Strength/Tone: Within functional levels RUE Sensation: WFL - Light Touch RUE Coordination: WFL - gross/fine motor Left Upper Extremity Assessment LUE ROM/Strength/Tone: Within functional levels LUE Sensation: WFL - Light Touch LUE Coordination: WFL -  gross/fine motor Trunk Assessment Trunk Assessment: Normal   Mobility Bed Mobility Bed Mobility: Supine to Sit Supine to Sit: 5: Supervision;HOB flat  Transfers Transfers: Sit to Stand Sit to Stand: 5: Supervision;With upper extremity assist;With armrests;From chair/3-in-1 Stand to Sit: 5: Supervision;With armrests;To chair/3-in-1 Details for Transfer Assistance: Min cues for hand placement and positioning of the left foot.      Balance Balance Balance Assessed: Yes Static Standing Balance Static Standing - Balance Support: Right upper extremity supported;Left upper extremity supported Static Standing - Level of Assistance: 5: Stand by assistance  End of Session OT - End of Session Activity Tolerance: Patient tolerated treatment well Patient left: in chair;with call bell/phone within reach   [facility] OTR/L 07/08/2011, 11:33 AM Pager number [phone]

## 2011-07-08 NOTE — Progress Notes (Signed)
Subjective: 2 Days Post-Op Procedure(s) (LRB): CONVERSION TO TOTAL HIP (Left) Patient reports pain as mild.   Patient seen in rounds with Dr. Lequita Halt. Patient is well, and has had no acute complaints or problems Plan is to go Home after hospital stay.  Objective: Vital signs in last 24 hours: Temp:  [97.8 F (36.6 C)-99.2 F (37.3 C)] 98.9 F (37.2 C) (06/14 0557) Pulse Rate:  [58-80] 62  (06/14 0557) Resp:  [14-16] 16  (06/14 0557) BP: (100-125)/(60-72) 125/72 mmHg (06/14 0557) SpO2:  [95 %-98 %] 96 % (06/14 0557)  Intake/Output from previous day:  Intake/Output Summary (Last 24 hours) at 07/08/11 0743 Last data filed at 07/08/11 0558  Gross per 24 hour  Intake 1200.33 ml  Output   3150 ml  Net -1949.67 ml    Intake/Output this shift:    Labs:  Basename 07/08/11 0407 07/07/11 0430  HGB 10.8* 11.6*    Basename 07/08/11 0407 07/07/11 0430  WBC 8.2 10.5  RBC 3.35* 3.61*  HCT 31.1* 32.9*  PLT 144* 157    Basename 07/08/11 0407 07/07/11 0430  NA 139 135  K 3.6 4.1  CL 104 100  CO2 31 27  BUN 11 10  CREATININE 1.07 1.00  GLUCOSE 118* 181*  CALCIUM 8.2* 8.0*   No results found for this basename: LABPT:2,INR:2 in the last 72 hours  EXAM General - Patient is Alert, Appropriate and Oriented Extremity - Neurovascular intact Sensation intact distally Dorsiflexion/Plantar flexion intact Dressing/Incision - clean, dry, no drainage, healing Motor Function - intact, moving foot and toes well on exam.   Past Medical History  Diagnosis Date  . Irritable bowel   . Cancer     Assessment/Plan: 2 Days Post-Op Procedure(s) (LRB): CONVERSION TO TOTAL HIP (Left) Principal Problem:  *Failed total hip arthroplasty   Advance diet Up with therapy Discharge home with home health  DVT Prophylaxis - Xarelto Partial-Weight Bearing 25-50% left Leg Plan home tomorrow with HHPT  Kristion Holifield 07/08/2011, 7:43 AM

## 2011-07-08 NOTE — Progress Notes (Signed)
Physical Therapy Treatment Patient Details Name: [name] MRN: [id number] DOB: [birth year] Today's Date: 07/08/2011 Time: 7829-5621 PT Time Calculation (min): 35 min  PT Assessment / Plan / Recommendation Comments on Treatment Session       Follow Up Recommendations  Home health PT    Barriers to Discharge        Equipment Recommendations  3 in 1 bedside comode    Recommendations for Other Services OT consult  Frequency 7X/week   Plan Discharge plan remains appropriate    Precautions / Restrictions Precautions Precautions: Posterior Hip Precaution Comments: Pt recalls all THP without cues Restrictions Weight Bearing Restrictions: No LLE Weight Bearing: Partial weight bearing LLE Partial Weight Bearing Percentage or Pounds: 25-50%   Pertinent Vitals/Pain 3/10 with activity; pt premedicated, ice pack provided    Mobility  Bed Mobility Bed Mobility: Supine to Sit Supine to Sit: 5: Supervision;HOB flat Sit to Supine: 4: Min assist Details for Bed Mobility Assistance: cues for sequence and for use of UEs and R LE to self assist Transfers Transfers: Sit to Stand;Stand to Sit Sit to Stand: 5: Supervision;With upper extremity assist;With armrests;From chair/3-in-1 Stand to Sit: 5: Supervision;With armrests;To chair/3-in-1 Details for Transfer Assistance: Min cues for hand placement and positioning of the left foot. Ambulation/Gait Ambulation/Gait Assistance: 4: Min guard Ambulation Distance (Feet): 150 Feet Assistive device: Rolling walker Ambulation/Gait Assistance Details: cues for ER on L, position from RW and posture Gait Pattern: Step-to pattern    Exercises Total Joint Exercises Ankle Circles/Pumps: AROM;15 reps;Supine;Both Quad Sets: AROM;15 reps;Both;Supine Gluteal Sets: AAROM;Left;Supine;10 reps Heel Slides: AAROM;15 reps;Left;Supine Hip ABduction/ADduction: AAROM;15 reps;Left;Supine   PT Diagnosis:    PT Problem List:   PT Treatment Interventions:       PT Goals Acute Rehab PT Goals PT Goal Formulation: With patient Time For Goal Achievement: 07/13/11 Potential to Achieve Goals: Good Pt will go Supine/Side to Sit: with supervision PT Goal: Supine/Side to Sit - Progress: Progressing toward goal Pt will go Sit to Supine/Side: with supervision PT Goal: Sit to Supine/Side - Progress: Progressing toward goal Pt will go Sit to Stand: with supervision PT Goal: Sit to Stand - Progress: Progressing toward goal Pt will go Stand to Sit: with supervision PT Goal: Stand to Sit - Progress: Progressing toward goal Pt will Ambulate: 51 - 150 feet;with supervision;with rolling walker PT Goal: Ambulate - Progress: Progressing toward goal Pt will Go Up / Down Stairs: 1-2 stairs;with min assist;with least restrictive assistive device  Visit Information  Last PT Received On: 07/08/11 Assistance Needed: +1    Subjective Data  Subjective: I'm a little stiff today Patient Stated Goal: Resume previous lifestyle with decreased pain   Cognition  Overall Cognitive Status: Appears within functional limits for tasks assessed/performed Arousal/Alertness: Awake/alert Orientation Level: Appears intact for tasks assessed Behavior During Session: [facility] for tasks performed    Balance  Balance Balance Assessed: Yes Static Standing Balance Static Standing - Balance Support: Right upper extremity supported;Left upper extremity supported Static Standing - Level of Assistance: 5: Stand by assistance  End of Session PT - End of Session Activity Tolerance: Patient tolerated treatment well Patient left: in chair;with call bell/phone within reach Nurse Communication: Mobility status    Jency Schnieders 07/08/2011, 1:02 PM

## 2011-07-08 NOTE — Progress Notes (Signed)
Physical Therapy Treatment Patient Details Name: [name] MRN: [id number] DOB: Apr 23, 1945 Today's Date: 07/08/2011 Time: 4098-1191 PT Time Calculation (min): 41 min  PT Assessment / Plan / Recommendation Comments on Treatment Session  Reviewed pt concerns with performance of bathroom tasks     Follow Up Recommendations  Home health PT    Barriers to Discharge        Equipment Recommendations  Rolling walker with 5" wheels;3 in 1 bedside comode    Recommendations for Other Services OT consult  Frequency 7X/week   Plan Discharge plan remains appropriate    Precautions / Restrictions Precautions Precautions: Posterior Hip Precaution Comments: Pt recalls all THP without cues Restrictions Weight Bearing Restrictions: No LLE Weight Bearing: Partial weight bearing LLE Partial Weight Bearing Percentage or Pounds: 25-50%   Pertinent Vitals/Pain     Mobility  Bed Mobility Bed Mobility: Sit to Supine Sit to Supine: 4: Min guard Details for Bed Mobility Assistance: cues for sequence and for use of UEs and R LE to self assist Transfers Transfers: Sit to Stand;Stand to Sit Sit to Stand: 5: Supervision;With upper extremity assist;From chair/3-in-1;With armrests Stand to Sit: 5: Supervision;With upper extremity assist;To bed;To chair/3-in-1;With armrests Details for Transfer Assistance: Min cues for hand placement and positioning of the left foot. Ambulation/Gait Ambulation/Gait Assistance: 4: Min guard;5: Supervision Ambulation Distance (Feet): 58 Feet (58' and 20') Assistive device: Rolling walker Ambulation/Gait Assistance Details: min cues for posture Gait Pattern: Step-to pattern Stairs: Yes Stairs Assistance: 4: Min assist Stairs Assistance Details (indicate cue type and reason): cues for sequence and for foot/crutch/RW placement Stair Management Technique: Backwards;Forwards;With crutches;With walker;One rail Right;One rail Left Number of Stairs: 2  (2 steps fwd with  crutch and rail, 2 step bkwd with RW)    Exercises     PT Diagnosis:    PT Problem List:   PT Treatment Interventions:     PT Goals Acute Rehab PT Goals PT Goal Formulation: With patient Time For Goal Achievement: 07/13/11 Potential to Achieve Goals: Good Pt will go Supine/Side to Sit: with supervision PT Goal: Supine/Side to Sit - Progress: Progressing toward goal Pt will go Sit to Supine/Side: with supervision PT Goal: Sit to Supine/Side - Progress: Progressing toward goal Pt will go Sit to Stand: with supervision PT Goal: Sit to Stand - Progress: Progressing toward goal Pt will go Stand to Sit: with supervision PT Goal: Stand to Sit - Progress: Progressing toward goal Pt will Ambulate: 51 - 150 feet;with supervision;with rolling walker PT Goal: Ambulate - Progress: Progressing toward goal Pt will Go Up / Down Stairs: 1-2 stairs;with min assist;with least restrictive assistive device PT Goal: Up/Down Stairs - Progress: Progressing toward goal  Visit Information  Last PT Received On: 07/08/11 Assistance Needed: +1    Subjective Data  Subjective: I'm concerned about stairs and how to manage things in the bathroom Patient Stated Goal: Resume previous lifestyle with decreased pain   Cognition  Overall Cognitive Status: Appears within functional limits for tasks assessed/performed Arousal/Alertness: Awake/alert Orientation Level: Appears intact for tasks assessed Behavior During Session: [facility] for tasks performed    Balance     End of Session PT - End of Session Activity Tolerance: Patient tolerated treatment well Patient left: in bed;with call bell/phone within reach Nurse Communication: Mobility status    [name] 07/08/2011, 4:43 PM

## 2011-07-09 LAB — CBC
MCH: 31.8 pg (ref 26.0–34.0)
MCV: 93.1 fL (ref 78.0–100.0)
Platelets: 144 10*3/uL — ABNORMAL LOW (ref 150–400)
RDW: 12.9 % (ref 11.5–15.5)
WBC: 8 10*3/uL (ref 4.0–10.5)

## 2011-07-09 MED ORDER — OXYCODONE HCL 5 MG PO TABS: 5.0000 mg | ORAL_TABLET | ORAL | Status: AC | PRN

## 2011-07-09 MED ORDER — METHOCARBAMOL 500 MG PO TABS: 500.0000 mg | ORAL_TABLET | Freq: Four times a day (QID) | ORAL | Status: AC | PRN

## 2011-07-09 MED ORDER — RIVAROXABAN 10 MG PO TABS: 10.0000 mg | ORAL_TABLET | Freq: Every day | ORAL | Status: DC

## 2011-07-09 MED ORDER — SODIUM CHLORIDE 0.9 % IV SOLN
Freq: Once | INTRAVENOUS | Status: AC
  Administered 2011-07-09: 12:00:00 via INTRAVENOUS

## 2011-07-09 NOTE — Progress Notes (Signed)
Pt stable, scripts, d/c instructions, equipment given with no questions/concerns voiced by patient/wife.  Pt transported via wheelchair to private vehicle with NT and wife.

## 2011-07-09 NOTE — Progress Notes (Signed)
Physical Therapy Treatment Patient Details Name: XZAVIAR MALOOF MRN: [id number] DOB: January 19, 1946 Today's Date: 07/09/2011 Time: 7829-5621 PT Time Calculation (min): 25 min  PT Assessment / Plan / Recommendation Comments on Treatment Session  Assisted pt in/ot shower with instructions on proper backward approach sequencing.  Left pt in room with spouse to get pt dressed.  Instructed on proper dressing tech to avoid hip flex > 90'.    Follow Up Recommendations  Home health PT    Barriers to Discharge        Equipment Recommendations       Recommendations for Other Services    Frequency 7X/week   Plan Discharge plan remains appropriate    Precautions / Restrictions Precautions Precautions: Posterior Hip Precaution Comments: Pt recalls all THP without cues  Restrictions Weight Bearing Restrictions: Yes LLE Weight Bearing: Partial weight bearing LLE Partial Weight Bearing Percentage or Pounds: Pt recalls PWB without cues  Other Position/Activity Restrictions: 25-50%    Pertinent Vitals/Pain C/o "soreness"    Mobility  Bed Mobility Details for Bed Mobility Assistance: Pt OOB in recliner Transfers Transfers: Sit to Stand;Stand to Sit Sit to Stand: 5: Supervision;From chair/3-in-1 Stand to Sit: 5: Supervision;To chair/3-in-1 Details for Transfer Assistance: shower transfer with 50% VC's on proper tech Ambulation/Gait Ambulation/Gait Assistance: 4: Min guard Ambulation Distance (Feet): 30 Feet (15' x 2) Assistive device: Rolling walker Ambulation/Gait Assistance Details: amb to and from bathroom.  No c/o dizzyness. Gait Pattern: Step-to pattern Gait velocity: decreased Stairs: No    PT Goals                                         progressing    Visit Information  Last PT Received On: 07/09/11 Assistance Needed: +1    Subjective Data      Cognition    good   Balance   good w/ RW  End of Session PT - End of Session Equipment Utilized During Treatment: Gait  belt Activity Tolerance: Patient tolerated treatment well Patient left: in chair;with call bell/phone within reach;with family/visitor present;Other (comment) (with spouse assisting with getting pt dressed)    Armando Reichert 07/09/2011, 3:56 PM

## 2011-07-09 NOTE — Progress Notes (Signed)
Physical Therapy Treatment Patient Details Name: [name] MRN: [id number] DOB: [birth year] Today's Date: 07/09/2011 Time: 1100-1120 PT Time Calculation (min): 20 min  PT Assessment / Plan / Recommendation Comments on Treatment Session  Allowed pt to rest and eat something befor seeing agin.  BP prior to amb 106/67 pt reclined in chair.  BP after amb 75" 97/58 with mod c/o "not feeling well".  RN notfied.    Follow Up Recommendations  Home health PT    Barriers to Discharge        Equipment Recommendations       Recommendations for Other Services    Frequency 7X/week   Plan Discharge plan remains appropriate    Precautions / Restrictions     Pertinent Vitals/Pain C/o "some"    Mobility  Bed Mobility Details for Bed Mobility Assistance: Pt OOB in recliner Transfers Transfers: Sit to Stand;Stand to Sit Sit to Stand: 5: Supervision;From chair/3-in-1 Stand to Sit: 5: Supervision;To chair/3-in-1 Details for Transfer Assistance: cues for sequence, THP and use of UEs and L LE to self assist  Ambulation/Gait Ambulation/Gait Assistance: 4: Min guard Ambulation Distance (Feet): 75 Feet Assistive device: Rolling walker Ambulation/Gait Assistance Details: Monitoring BP with excersion.  Before amb BP 106/67.....after amb  75' BP 97/58 with mod c/o dizzyness. Reported to RN. Gait Pattern: Step-to pattern;Decreased stance time - right;Trunk flexed Gait velocity: decreased Stairs: No     PT Goals   progressing    Visit Information  Last PT Received On: 07/09/11 Assistance Needed: +1    Subjective Data      Cognition    good   Balance   fair  End of Session PT - End of Session Equipment Utilized During Treatment: Gait belt Activity Tolerance: Treatment limited secondary to medical complications (Comment);Other (comment) (hypotension with exersion)    Felecia Shelling  PTA [facility]  Acute  Rehab Pager     [phone]

## 2011-07-09 NOTE — Progress Notes (Signed)
Subjective: Doing Ok, Little sorer today, difficult time sitting on camode.  Objective: Vital signs in last 24 hours: Temp:  [98.5 F (36.9 C)-99.6 F (37.6 C)] 99.1 F (37.3 C) (06/15 0546) Pulse Rate:  [82-91] 82  (06/15 0546) Resp:  [14-16] 16  (06/15 0546) BP: (97-119)/(63-68) 119/68 mmHg (06/15 0546) SpO2:  [95 %-98 %] 96 % (06/15 0546)  Intake/Output from previous day: 06/14 0701 - 06/15 0700 In: 484 [P.O.:480; IV Piggyback:4] Out: 780 [Urine:780] Intake/Output this shift:     Basename 07/09/11 0435 07/08/11 0407 07/07/11 0430  HGB 10.6* 10.8* 11.6*    Basename 07/09/11 0435 07/08/11 0407  WBC 8.0 8.2  RBC 3.33* 3.35*  HCT 31.0* 31.1*  PLT 144* 144*    Basename 07/08/11 0407 07/07/11 0430  NA 139 135  K 3.6 4.1  CL 104 100  CO2 31 27  BUN 11 10  CREATININE 1.07 1.00  GLUCOSE 118* 181*  CALCIUM 8.2* 8.0*   No results found for this basename: LABPT:2,INR:2 in the last 72 hours  Cons alert comfortable, left hip dressing clean and dry, no sign of infection.Leg NMVI  Assessment/Plan: POD#3 Convert uni to THA left doing well. Will D/C home.    Jamelle Rushing 07/09/2011, 7:50 AM

## 2011-07-09 NOTE — Progress Notes (Signed)
Physical Therapy Treatment Patient Details Name: [name] MRN: [id number] DOB: 24-Apr-1945 Today's Date: 07/09/2011 Time: 6295-2841 PT Time Calculation (min): 35 min  PT Assessment / Plan / Recommendation Comments on Treatment Session  Pt plans to D/C to home today.  Spouse present during session.  Amb in hallway, pt stated he felt fine.  Praxcticed going up 4 steps using one right rail and one crutch.  At tops of the steps pt became pale with MAX c/o "not feeling well".  Vitals showed low BP.  Reported to nurse.    Follow Up Recommendations  Home health PT    Barriers to Discharge        Equipment Recommendations       Recommendations for Other Services    Frequency 7X/week   Plan Discharge plan remains appropriate    Precautions / Restrictions     Pertinent Vitals/Pain C/o "not feeling well", headache and mild nausea    Mobility  Bed Mobility Details for Bed Mobility Assistance: Pt OOB in recliner Transfers Transfers: Sit to Stand;Stand to Sit Sit to Stand: 5: Supervision;From chair/3-in-1 Stand to Sit: 5: Supervision Details for Transfer Assistance: One VC to avoid hip flex during sit to stand Ambulation/Gait Ambulation/Gait Assistance: 4: Min guard Ambulation Distance (Feet): 50 Feet Assistive device: Rolling walker Ambulation/Gait Assistance Details: one VC on proper walker to self distance Gait Pattern: Step-to pattern;Decreased stance time - left;Trunk flexed Gait velocity: decreased Stairs: Yes Stairs Assistance: 4: Min assist Stairs Assistance Details (indicate cue type and reason): 25% VC's on proper technique and with spouse present.  When at top of steps pt became pale, dizzy and c/o "not feeling we;ll".  Chair brought to pt.  Vitals;BP 100/55, HR 68, temp 99, RA O2 98% Stair Management Technique: One rail Right;With crutches Number of Stairs: 4  Wheelchair Mobility Wheelchair Mobility: No     PT Goals   progressing    Visit Information  Last PT  Received On: 07/09/11 Assistance Needed: +1                   End of Session PT - End of Session Equipment Utilized During Treatment: Gait belt Activity Tolerance: Patient limited by fatigue;Treatment limited secondary to medical complications (Comment);Other (comment) (orthostatic after excersion performing stairs)    Felecia Shelling  PTA [facility]  Acute  Rehab Pager     [phone]

## 2011-07-09 NOTE — Progress Notes (Signed)
Physical Therapy Treatment Patient Details Name: [name] MRN: [id number] DOB: 1945-04-15 Today's Date: 07/09/2011 Time: 1100-1120 PT Time Calculation (min): 20 min  PT Assessment / Plan / Recommendation Comments on Treatment Session  Pt complete 500cc IV fluids and rested.  Stated he felt better.  BP before amb 113/70.  BP after amb 80' 120/69 with no c/o dizzyness.  Still a slight headache and temp 99.7, reported to nurse. Performed steps again and did better. No c/o.   Follow Up Recommendations  Home health PT                      Frequency 7X/week   Plan Discharge plan remains appropriate    Precautions / Restrictions   PBW L LE  Pertinent Vitals/Pain C/o 3/10 L hip pain    Mobility  Bed Mobility Details for Bed Mobility Assistance: Pt OOB in recliner Transfers Transfers: Sit to Stand;Stand to Sit Sit to Stand: 5: Supervision;From chair/3-in-1 Stand to Sit: 5: Supervision;To chair/3-in-1 Details for Transfer Assistance: cues for sequence, THP and use of UEs and L LE to self assist  Ambulation/Gait Ambulation/Gait Assistance: 4: Min guard Ambulation Distance (Feet): 80 Feet Assistive device: Rolling walker Ambulation/Gait Assistance Details: Monitoring BP with excersion.  Before amb BP 113/70.Marland Kitchen..Marland Kitchenafter 80' BP 120/69 with no c/o dizzyness. Reported to RN. Gait Pattern: Step-to pattern;Decreased stance time - right;Trunk flexed Gait velocity: decreased Stairs: yes Stairs Assistance: 4: Glass blower/designer Details (indicate cue type and reason): one  VC's on proper techniquet. Stair Management Technique: One rail Right;With crutches Number of Stairs: 4  Wheelchair Mobility Wheelchair Mobility: No       Visit Information  Last PT Received On: 07/09/11 Assistance Needed: +1    Subjective Data      Cognition    good   Balance   good with RW  End of Session Positioned in recliner with call light in reach.  Progress reported to RN.     Felecia Shelling  PTA [facility]  Acute  Rehab Pager     [phone]

## 2011-08-01 NOTE — Discharge Summary (Signed)
Physician Discharge Summary   Patient ID: [name] MRN: [id number] DOB/AGE: December 13, 1945 66 y.o.  Admit date: 07/06/2011 Discharge date: 07/09/2011  Primary Diagnosis: Failed left unipolar hemiarthroplasty.   Admission Diagnoses:  Past Medical History  Diagnosis Date  . Irritable bowel   . Cancer    Discharge Diagnoses:   Principal Problem:  *Failed total hip arthroplasty  Procedure: Procedure(s) (LRB): CONVERSION TO TOTAL HIP (Left)   Consults: None  HPI: Mr. Schaum is a 66 year old male who had a left hip unipolar hemiarthroplasty done many years ago. Recently, he has  gone on to developing increased left groin pain and left thigh pain.The radiographs showed probable loosening of the stem and showed definite erosion of the acetabulum. It has migrated superior and medial. He presents now for conversion to a total hip arthroplasty, both revising the femoral component and placing an acetabular component.  Laboratory Data: Hospital Outpatient Visit on 06/28/2011  Component Date Value Range Status  . aPTT 06/28/2011 30  24 - 37 seconds Final  . WBC 06/28/2011 4.4  4.0 - 10.5 K/uL Final  . RBC 06/28/2011 4.66  4.22 - 5.81 MIL/uL Final  . Hemoglobin 06/28/2011 14.8  13.0 - 17.0 g/dL Final  . HCT 41/32/4401 42.7  39.0 - 52.0 % Final  . MCV 06/28/2011 91.6  78.0 - 100.0 fL Final  . MCH 06/28/2011 31.8  26.0 - 34.0 pg Final  . MCHC 06/28/2011 34.7  30.0 - 36.0 g/dL Final  . RDW 02/72/5366 12.5  11.5 - 15.5 % Final  . Platelets 06/28/2011 214  150 - 400 K/uL Final  . Sodium 06/28/2011 140  135 - 145 mEq/L Final  . Potassium 06/28/2011 4.0  3.5 - 5.1 mEq/L Final  . Chloride 06/28/2011 104  96 - 112 mEq/L Final  . CO2 06/28/2011 27  19 - 32 mEq/L Final  . Glucose, Bld 06/28/2011 89  70 - 99 mg/dL Final  . BUN 44/03/4740 12  6 - 23 mg/dL Final  . Creatinine, Ser 06/28/2011 1.06  0.50 - 1.35 mg/dL Final  . Calcium 59/56/3875 9.1  8.4 - 10.5 mg/dL Final  . Total Protein  06/28/2011 6.4  6.0 - 8.3 g/dL Final  . Albumin 64/33/2951 3.8  3.5 - 5.2 g/dL Final  . AST 88/41/6606 19  0 - 37 U/L Final  . ALT 06/28/2011 10  0 - 53 U/L Final  . Alkaline Phosphatase 06/28/2011 42  39 - 117 U/L Final  . Total Bilirubin 06/28/2011 0.4  0.3 - 1.2 mg/dL Final  . GFR calc non Af Amer 06/28/2011 72* >90 mL/min Final  . GFR calc Af Amer 06/28/2011 84* >90 mL/min Final   Comment:                                 The eGFR has been calculated                          using the CKD EPI equation.                          This calculation has not been                          validated in all clinical  situations.                          eGFR's persistently                          <90 mL/min signify                          possible Chronic Kidney Disease.  Marland Kitchen Prothrombin Time 06/28/2011 13.3  11.6 - 15.2 seconds Final  . INR 06/28/2011 0.99  0.00 - 1.49 Final  . Color, Urine 06/28/2011 YELLOW  YELLOW Final  . APPearance 06/28/2011 CLEAR  CLEAR Final  . Specific Gravity, Urine 06/28/2011 1.012  1.005 - 1.030 Final  . pH 06/28/2011 7.0  5.0 - 8.0 Final  . Glucose, UA 06/28/2011 NEGATIVE  NEGATIVE mg/dL Final  . Hgb urine dipstick 06/28/2011 MODERATE* NEGATIVE Final  . Bilirubin Urine 06/28/2011 NEGATIVE  NEGATIVE Final  . Ketones, ur 06/28/2011 NEGATIVE  NEGATIVE mg/dL Final  . Protein, ur 16/10/9602 NEGATIVE  NEGATIVE mg/dL Final  . Urobilinogen, UA 06/28/2011 0.2  0.0 - 1.0 mg/dL Final  . Nitrite 54/09/8117 NEGATIVE  NEGATIVE Final  . Leukocytes, UA 06/28/2011 NEGATIVE  NEGATIVE Final  . MRSA, PCR 06/28/2011 NEGATIVE  NEGATIVE Final  . Staphylococcus aureus 06/28/2011 NEGATIVE  NEGATIVE Final   Comment:                                 The Xpert SA Assay (FDA                          approved for NASAL specimens                          only), is one component of                          a comprehensive surveillance                           program.  It is not intended                          to diagnose infection nor to                          guide or monitor treatment.  . RBC / HPF 06/28/2011 7-10  <3 RBC/hpf Final  . Bacteria, UA 06/28/2011 RARE  RARE Final   No results found for this basename: HGB:5 in the last 72 hours No results found for this basename: WBC:2,RBC:2,HCT:2,PLT:2 in the last 72 hours No results found for this basename: NA:2,K:2,CL:2,CO2:2,BUN:2,CREATININE:2,GLUCOSE:2,CALCIUM:2 in the last 72 hours No results found for this basename: LABPT:2,INR:2 in the last 72 hours  X-Rays:Dg Pelvis Portable  07/06/2011  *RADIOLOGY REPORT*  Clinical Data: Hip replacement.  PORTABLE PELVIS  Comparison: Plain film of the abdomen and 09/30/2010.  Findings: Left hip hemiarthroplasty has been replaced with a new left total hip.  The device is located.  No fracture is identified. Gas in the soft tissues and surgical staples are noted.  A surgical drain is in place.  Avascular necrosis of  the right hip with some flattening of the superior femoral head noted.  IMPRESSION:  1.  New left total hip without evidence of complication. 2.  Avascular necrosis right femoral head.  Original Report Authenticated By: Bernadene Bell. D'ALESSIO, M.D.   Dg Hip Portable 1 View Left  07/06/2011  *RADIOLOGY REPORT*  Clinical Data: Hip replacement.  PORTABLE LEFT HIP - 1 VIEW  Comparison: Plain films left hip 06/28/2011.  Findings: Left hip pain arthroplasty has been replaced with a total hip.  Device is located.  There is no fracture.  Gas in the soft tissues and surgical drain noted.  IMPRESSION: Left total hip without evidence of complication.  Original Report Authenticated By: Bernadene Bell. D'ALESSIO, M.D.   Dg Hip Portable 1 View Left  07/06/2011  *RADIOLOGY REPORT*  Clinical Data: Intraoperative assessment of the left hip arthroplasty.  PORTABLE LEFT HIP - 1 VIEW  Comparison: 06/28/2011  Findings: The left hip unipolar arthroplasty has been replaced with a  total hip arthroplasty.  This has a long stem; the femoral head component of this arthroplasty is not currently visible, potentially not yet attached or radiolucent.  The acetabular component appears satisfactorily positioned in its visualized portion, although the upper margin of the acetabular shell is not included on these images.  The lateral projection includes the distal portion of the stem all the way down to the knee.  No fracture of the proximal femur is observed.  IMPRESSION:  1.  Intraoperative radiograph demonstrates a long stem total hip prosthesis without visible complicating feature.  The head of the femoral component of the prosthesis is not visible radiographically if present.  Upper margin of acetabular shell is excluded.  Original Report Authenticated By: Dellia Cloud, M.D.    EKG:No orders found for this or any previous visit.   Hospital Course: Patient was admitted to [facility] and taken to the OR and underwent the above state procedure without complications.  Patient tolerated the procedure well and was later transferred to the recovery room and then to the orthopaedic floor for postoperative care.  They were given PO and IV analgesics for pain control following their surgery.  They were given 24 hours of postoperative antibiotics and started on DVT prophylaxis in the form of Xarelto.   PT and OT were ordered for total hip protocol.  The patient was allowed to be PWB with therapy. Discharge planning was consulted to help with postop disposition and equipment needs.  Patient had a decent night on the evening of surgery and started to get up OOB with therapy on day one.  Hemovac drain was pulled without difficulty.  The knee immobilizer was removed and discontinued.  Continued to work with therapy into day two.  Dressing was changed on day two and the incision was healing well.  By day three, the patient had progressed with therapy and meeting their goals.  Incision was  healing well.  Patient was seen in rounds and was ready to go home.  Discharge Medications: Prior to Admission medications   Medication Sig Start Date End Date Taking? Authorizing Provider  hyoscyamine (LEVSIN SL) 0.125 MG SL tablet Place 0.125 mg under the tongue every 4 (four) hours as needed. IBS    Historical Provider, MD  rivaroxaban (XARELTO) 10 MG TABS tablet Take 1 tablet (10 mg total) by mouth daily with breakfast. 07/09/11   Jamelle Rushing, PA    Diet: Regular diet Activity:WBAT No bending hip over 90 degrees- A "L" Angle  Do not cross legs Do not let foot roll inward When turning these patients a pillow should be placed between the patient's legs to prevent crossing. Patients should have the affected knee fully extended when trying to sit or stand from all surfaces to prevent excessive hip flexion. When ambulating and turning toward the affected side the affected leg should have the toes turned out prior to moving the walker and the rest of patient's body as to prevent internal rotation/ turning in of the leg. Abduction pillows are the most effective way to prevent a patient from not crossing legs or turning toes in at rest. If an abduction pillow is not ordered placing a regular pillow length wise between the patient's legs is also an effective reminder. It is imperative that these precautions be maintained so that the surgical hip does not dislocate. Follow-up:in 2 weeks Disposition - Home Discharged Condition: good   Discharge Orders    Future Orders Please Complete By Expires   Diet general      Call MD / Call 911      Comments:   If you experience chest pain or shortness of breath, CALL 911 and be transported to the hospital emergency room.  If you develope a fever above 101 F, pus (white drainage) or increased drainage or redness at the wound, or calf pain, call your surgeon's office.   Increase activity slowly as tolerated      Discharge instructions      Comments:    Call for a follow up in 2 weeks [phone].   Follow the hip precautions as taught in Physical Therapy      Change dressing      Comments:   You may change your dressing daily with sterile 4 x 4 inch gauze dressing and paper tape.  You may clean the incision with alcohol prior to redressing   TED hose      Comments:   Use stockings (TED hose) for 3 weeks on both leg(s).  You may remove them at night for sleeping.     Medication List  As of 08/01/2011 10:17 AM   TAKE these medications         hyoscyamine 0.125 MG SL tablet   Commonly known as: LEVSIN SL   Place 0.125 mg under the tongue every 4 (four) hours as needed. IBS      rivaroxaban 10 MG Tabs tablet   Commonly known as: XARELTO   Take 1 tablet (10 mg total) by mouth daily with breakfast.             Signed: Susanna Benge 08/01/2011, 10:17 AM

## 2012-05-24 ENCOUNTER — Other Ambulatory Visit (HOSPITAL_COMMUNITY): Payer: Self-pay | Admitting: Family Medicine

## 2012-05-24 DIAGNOSIS — I10 Essential (primary) hypertension: Secondary | ICD-10-CM

## 2012-06-07 ENCOUNTER — Ambulatory Visit (HOSPITAL_COMMUNITY)
Admission: RE | Admit: 2012-06-07 | Discharge: 2012-06-07 | Disposition: A | Discharge status: Home / Self Care | Payer: Medicare Other | Source: Ambulatory Visit | Attending: Cardiovascular Disease | Admitting: Cardiovascular Disease

## 2012-06-07 ENCOUNTER — Ambulatory Visit (HOSPITAL_BASED_OUTPATIENT_CLINIC_OR_DEPARTMENT_OTHER)
Admission: RE | Admit: 2012-06-07 | Discharge: 2012-06-07 | Disposition: A | Discharge status: Home / Self Care | Payer: Medicare Other | Source: Ambulatory Visit | Attending: Cardiovascular Disease | Admitting: Cardiovascular Disease

## 2012-06-07 DIAGNOSIS — G458 Other transient cerebral ischemic attacks and related syndromes: Secondary | ICD-10-CM

## 2012-06-07 DIAGNOSIS — I714 Abdominal aortic aneurysm, without rupture: Secondary | ICD-10-CM

## 2012-06-07 DIAGNOSIS — I10 Essential (primary) hypertension: Secondary | ICD-10-CM

## 2012-06-07 NOTE — Progress Notes (Signed)
Upper Extremity Arterial Duplex Completed. Negative study with no evidence for stenosis. [name]

## 2012-06-07 NOTE — Progress Notes (Signed)
Abdominal Aortic Ultrasound Completed. Negative for an abdominal aortic aneurysm. Erlene Quan

## 2013-07-01 ENCOUNTER — Encounter (INDEPENDENT_AMBULATORY_CARE_PROVIDER_SITE_OTHER): Payer: Self-pay

## 2013-07-01 ENCOUNTER — Ambulatory Visit (INDEPENDENT_AMBULATORY_CARE_PROVIDER_SITE_OTHER): Payer: Medicare Other | Admitting: Emergency Medicine

## 2013-07-01 ENCOUNTER — Encounter: Payer: Self-pay | Admitting: Emergency Medicine

## 2013-07-01 VITALS — BP 128/88 | HR 89 | Ht 68.0 in | Wt 187.0 lb

## 2013-07-01 DIAGNOSIS — R059 Cough, unspecified: Secondary | ICD-10-CM

## 2013-07-01 DIAGNOSIS — R05 Cough: Secondary | ICD-10-CM | POA: Insufficient documentation

## 2013-07-01 DIAGNOSIS — R053 Chronic cough: Secondary | ICD-10-CM | POA: Insufficient documentation

## 2013-07-01 MED ORDER — HYDROCOD POLST-CHLORPHEN POLST 10-8 MG/5ML PO LQCR: 5.0000 mL | Freq: Two times a day (BID) | ORAL | Status: DC | PRN

## 2013-07-01 MED ORDER — FLUTICASONE PROPIONATE 50 MCG/ACT NA SUSP
2.0000 | Freq: Every day | NASAL | Status: AC
Start: 2013-07-01 — End: ?

## 2013-07-01 MED ORDER — TRAMADOL HCL 50 MG PO TABS: 50.0000 mg | ORAL_TABLET | Freq: Four times a day (QID) | ORAL | Status: DC | PRN

## 2013-07-01 NOTE — Patient Instructions (Addendum)
Please continue your nasal irrigation every day Continue your fexofenadine and omeprazole as you are taking them  Start fluticasone 2 sprays each nostril twice a day Work hard to avoid throat clearing Use sugar-free candies to avoid throat clearing Take tramadol for your R rib pain Take tussionex 5cc every 12 hours for your cough Set aside time, possibly a weekend, to practice voice rest Follow with Dr Lamonte Sakai in 1 month

## 2013-07-01 NOTE — Progress Notes (Signed)
Subjective:    Patient ID: [name], male    DOB: 1945/04/18, 68 y.o.   MRN: [id number]  HPI 68 yo man, hx former tobacco (21 pk-yrs), hx of IBS, prostate CA, HTN, OA s/p L hip sgy. Has been seen by Dr Prince Rome for cough that resulted in a R rib fx 4/15. Started end of February '15. He has a hx of mild cough due to allergies but was well until February when he caught a URI. The URI sx improved but he kept the cough > developed R flank bruise, R lateral CP, kept a dry cough that occasionally brought up some clear phlegm. He was treated with azithro at some point when his mucous turned green. Was not treated with prednisone to his memory. He has been on tessalon perles, pain regimen for his R chest, codeine cough syrup. Cough has been helped by ultram, diclofenac, cyclobenzaprine. He is still having some allergy sx, a bit better since last time. No real GERD sx. He is taking nasal saline. Just stared back fexofenadine and omeprazole 3 days ago > may be helping.    Review of Systems  Constitutional: Negative for fever and unexpected weight change.  HENT: Positive for congestion, postnasal drip and sinus pressure. Negative for dental problem, ear pain, nosebleeds, rhinorrhea, sneezing, sore throat and trouble swallowing.   Eyes: Negative for redness and itching.  Respiratory: Positive for cough and shortness of breath. Negative for chest tightness and wheezing.   Cardiovascular: Negative for palpitations and leg swelling.  Gastrointestinal: Negative for nausea and vomiting.  Genitourinary: Negative for dysuria.  Musculoskeletal: Negative for joint swelling.  Skin: Negative for rash.  Neurological: Negative for headaches.  Hematological: Does not bruise/bleed easily.  Psychiatric/Behavioral: Negative for dysphoric mood. The patient is not nervous/anxious.     Past Medical History  Diagnosis Date  . Irritable bowel   . Cancer      No family history on file.   History   Social  History  . Marital Status: Married    Spouse Name: N/A    Number of Children: N/A  . Years of Education: N/A   Occupational History  . Not on file.   Social History Main Topics  . Smoking status: Former Smoker -- 2.00 packs/day for 22 years    Types: Cigarettes    Quit date: 01/24/1986  . Smokeless tobacco: Never Used  . Alcohol Use: No  . Drug Use: No  . Sexual Activity: Not on file   Other Topics Concern  . Not on file   Social History Narrative  . No narrative on file      No Known Allergies   Outpatient Prescriptions Prior to Visit  Medication Sig Dispense Refill  . hyoscyamine (LEVSIN SL) 0.125 MG SL tablet Place 0.125 mg under the tongue every 4 (four) hours as needed. IBS      . rivaroxaban (XARELTO) 10 MG TABS tablet Take 1 tablet (10 mg total) by mouth daily with breakfast.  18 tablet  0   No facility-administered medications prior to visit.         Objective:   Physical Exam Filed Vitals:   07/01/13 1531  BP: 128/88  Pulse: 89  Height: 5\' 8"  (1.727 m)  Weight: 187 lb (84.823 kg)  SpO2: 94%   Gen: Pleasant, well-nourished, in no distress,  normal affect  ENT: No lesions,  mouth clear,  oropharynx clear, no postnasal drip  Neck: No JVD, no TMG, no carotid bruits  Lungs: No use of accessory muscles, no dullness to percussion, clear without rales or rhonchi  Cardiovascular: RRR, heart sounds normal, no murmur or gallops, no peripheral edema  Musculoskeletal: No deformities, no cyanosis or clubbing  Neuro: alert, non focal  Skin: Warm, no lesions or rashes       Assessment & Plan:  Chronic cough Please continue your nasal irrigation every day Continue your fexofenadine and omeprazole as you are taking them  Start fluticasone 2 sprays each nostril twice a day Work hard to avoid throat clearing Use sugar-free candies to avoid throat clearing Take tramadol for your R rib pain Take tussionex 5cc every 12 hours for your cough Set aside  time, possibly a weekend, to practice voice rest Follow with Dr Lamonte Sakai in 1 month

## 2013-07-01 NOTE — Assessment & Plan Note (Signed)
Please continue your nasal irrigation every day Continue your fexofenadine and omeprazole as you are taking them  Start fluticasone 2 sprays each nostril twice a day Work hard to avoid throat clearing Use sugar-free candies to avoid throat clearing Take tramadol for your R rib pain Take tussionex 5cc every 12 hours for your cough Set aside time, possibly a weekend, to practice voice rest Follow with Dr Lamonte Sakai in 1 month

## 2013-07-02 ENCOUNTER — Telehealth: Payer: Self-pay | Admitting: Emergency Medicine

## 2013-07-02 NOTE — Telephone Encounter (Signed)
Called made pt aware of recs. Disks have been placed for p/u at the front. Nothing further needed

## 2013-07-02 NOTE — Telephone Encounter (Signed)
Called spoke with pt. He wants to know if he can have the 2 cd-roms he brought in yesterday back/ Also reports this AM he started coughing up thick green phlem. No other new symptoms other than what he complained about yesterday. Please advise RB thanks  No Known Allergies

## 2013-07-02 NOTE — Telephone Encounter (Signed)
He can have the disks back  No new recs for now - I'd like to see how he does with our current plan. If not improving then we'll reassess.

## 2013-08-01 ENCOUNTER — Ambulatory Visit (INDEPENDENT_AMBULATORY_CARE_PROVIDER_SITE_OTHER): Payer: Medicare Other | Admitting: Emergency Medicine

## 2013-08-01 ENCOUNTER — Encounter: Payer: Self-pay | Admitting: Emergency Medicine

## 2013-08-01 VITALS — BP 122/74 | HR 95 | Ht 67.5 in | Wt 188.0 lb

## 2013-08-01 DIAGNOSIS — R059 Cough, unspecified: Secondary | ICD-10-CM

## 2013-08-01 DIAGNOSIS — R05 Cough: Secondary | ICD-10-CM

## 2013-08-01 DIAGNOSIS — R053 Chronic cough: Secondary | ICD-10-CM

## 2013-08-01 NOTE — Patient Instructions (Signed)
Try stopping omeprazole to see if your cough worsens. If your cough does come back then RESTART the omeprazole 20mg  daily.  Continue fexofenadine in the morning and singulair every evening.  Use your tramadol, tussionex for cough suppression. Hopefully you will need it less and less.  Follow with Dr Lamonte Sakai as needed.

## 2013-08-01 NOTE — Assessment & Plan Note (Signed)
Try stopping omeprazole to see if your cough worsens. If your cough does come back then RESTART the omeprazole 20mg  daily.  Continue fexofenadine in the morning and singulair every evening.  Use your tramadol, tussionex for cough suppression. Hopefully you will need it less and less.  Follow with Dr Lamonte Sakai as needed.

## 2013-08-01 NOTE — Progress Notes (Signed)
Subjective:    Patient ID: [name], male    DOB: March 03, 1945, 68 y.o.   MRN: [id number]  Cough Associated symptoms include postnasal drip and shortness of breath. Pertinent negatives include no ear pain, eye redness, fever, headaches, rash, rhinorrhea, sore throat or wheezing.   68 yo man, hx former tobacco (44 pk-yrs), hx of IBS, prostate CA, HTN, OA s/p L hip sgy. Has been seen by Dr Prince Rome for cough that resulted in a R rib fx 4/15. Started end of February '15. He has a hx of mild cough due to allergies but was well until February when he caught a URI. The URI sx improved but he kept the cough > developed R flank bruise, R lateral CP, kept a dry cough that occasionally brought up some clear phlegm. He was treated with azithro at some point when his mucous turned green. Was not treated with prednisone to his memory. He has been on tessalon perles, pain regimen for his R chest, codeine cough syrup. Cough has been helped by ultram, diclofenac, cyclobenzaprine. He is still having some allergy sx, a bit better since last time. No real GERD sx. He is taking nasal saline. Just stared back fexofenadine and omeprazole 3 days ago > may be helping.   ROV 08/01/13 -- follows for cough, hx smoking, allergies. He returns after working on cyclical cough. He seems to have benefited from tramadol, cough suppression. He is still doing an allergy routine. Remains on omeprazole.    Review of Systems  Constitutional: Negative for fever and unexpected weight change.  HENT: Positive for congestion, postnasal drip and sinus pressure. Negative for dental problem, ear pain, nosebleeds, rhinorrhea, sneezing, sore throat and trouble swallowing.   Eyes: Negative for redness and itching.  Respiratory: Positive for cough and shortness of breath. Negative for chest tightness and wheezing.   Cardiovascular: Negative for palpitations and leg swelling.  Gastrointestinal: Negative for nausea and vomiting.  Genitourinary:  Negative for dysuria.  Musculoskeletal: Negative for joint swelling.  Skin: Negative for rash.  Neurological: Negative for headaches.  Hematological: Does not bruise/bleed easily.  Psychiatric/Behavioral: Negative for dysphoric mood. The patient is not nervous/anxious.     Past Medical History  Diagnosis Date  . Irritable bowel   . Cancer      No family history on file.   History   Social History  . Marital Status: Married    Spouse Name: N/A    Number of Children: N/A  . Years of Education: N/A   Occupational History  . Not on file.   Social History Main Topics  . Smoking status: Former Smoker -- 2.00 packs/day for 22 years    Types: Cigarettes    Quit date: 01/24/1986  . Smokeless tobacco: Never Used  . Alcohol Use: No  . Drug Use: No  . Sexual Activity: Not on file   Other Topics Concern  . Not on file   Social History Narrative  . No narrative on file      Allergies  Allergen Reactions  . Lisinopril     Other reaction(s): Cough     Outpatient Prescriptions Prior to Visit  Medication Sig Dispense Refill  . chlorpheniramine-HYDROcodone (TUSSIONEX PENNKINETIC ER) 10-8 MG/5ML LQCR Take 5 mLs by mouth every 12 (twelve) hours as needed for cough.  480 mL  0  . cyclobenzaprine (FLEXERIL) 5 MG tablet Take 5 mg by mouth 3 (three) times daily as needed for muscle spasms.      Marland Kitchen  fluticasone (FLONASE) 50 MCG/ACT nasal spray Place 2 sprays into both nostrils daily.  16 g  2  . glycopyrrolate (ROBINUL) 2 MG tablet Take 2 mg by mouth daily.      Marland Kitchen losartan (COZAAR) 50 MG tablet Take 50 mg by mouth daily.      . montelukast (SINGULAIR) 10 MG tablet Take 10 mg by mouth at bedtime.      . triamcinolone cream (KENALOG) 0.1 % Apply 1 application topically 2 (two) times daily.      . diclofenac (VOLTAREN) 75 MG EC tablet Take 75 mg by mouth 2 (two) times daily.      . hyoscyamine (ANASPAZ) 0.125 MG TBDP disintergrating tablet Place 0.125 mg under the tongue every 6 (six)  hours as needed.      . traMADol (ULTRAM) 50 MG tablet Take 1 tablet (50 mg total) by mouth every 6 (six) hours as needed.  45 tablet  3   No facility-administered medications prior to visit.         Objective:   Physical Exam Filed Vitals:   08/01/13 1422  BP: 122/74  Pulse: 95  Height: 5' 7.5" (1.715 m)  Weight: 188 lb (85.276 kg)  SpO2: 95%   Gen: Pleasant, well-nourished, in no distress,  normal affect  ENT: No lesions,  mouth clear,  oropharynx clear, no postnasal drip  Neck: No JVD, no TMG, no carotid bruits  Lungs: No use of accessory muscles, no dullness to percussion, clear without rales or rhonchi  Cardiovascular: RRR, heart sounds normal, no murmur or gallops, no peripheral edema  Musculoskeletal: No deformities, no cyanosis or clubbing  Neuro: alert, non focal  Skin: Warm, no lesions or rashes       Assessment & Plan:  Chronic cough Try stopping omeprazole to see if your cough worsens. If your cough does come back then RESTART the omeprazole 20mg  daily.  Continue fexofenadine in the morning and singulair every evening.  Use your tramadol, tussionex for cough suppression. Hopefully you will need it less and less.  Follow with Dr Lamonte Sakai as needed.

## 2013-11-08 ENCOUNTER — Other Ambulatory Visit: Payer: Self-pay

## 2014-07-21 ENCOUNTER — Other Ambulatory Visit: Payer: Self-pay

## 2017-02-14 ENCOUNTER — Institutional Professional Consult (permissible substitution): Payer: Self-pay | Admitting: Neurology

## 2017-03-01 ENCOUNTER — Ambulatory Visit (INDEPENDENT_AMBULATORY_CARE_PROVIDER_SITE_OTHER): Payer: Medicare Other | Admitting: Neurology

## 2017-03-01 ENCOUNTER — Encounter: Payer: Self-pay | Admitting: Neurology

## 2017-03-01 VITALS — BP 160/72 | HR 84 | Ht 68.0 in | Wt 195.0 lb

## 2017-03-01 DIAGNOSIS — R05 Cough: Secondary | ICD-10-CM

## 2017-03-01 DIAGNOSIS — R519 Headache, unspecified: Secondary | ICD-10-CM

## 2017-03-01 DIAGNOSIS — R058 Other specified cough: Secondary | ICD-10-CM

## 2017-03-01 DIAGNOSIS — R51 Headache: Secondary | ICD-10-CM | POA: Diagnosis not present

## 2017-03-01 DIAGNOSIS — G478 Other sleep disorders: Secondary | ICD-10-CM

## 2017-03-01 DIAGNOSIS — R351 Nocturia: Secondary | ICD-10-CM | POA: Diagnosis not present

## 2017-03-01 DIAGNOSIS — R0683 Snoring: Secondary | ICD-10-CM

## 2017-03-01 DIAGNOSIS — E663 Overweight: Secondary | ICD-10-CM | POA: Diagnosis not present

## 2017-03-01 NOTE — Progress Notes (Signed)
Subjective:    Patient ID: [name] is a 72 y.o. male.  HPI     Star Age, MD, PhD [facility] [address], [address]O. [place], [place] [address]  Dear Audelia Acton,   I saw your patient, [name], upon your kind request in my neurologic clinic today for initial consultation of his sleep disorder, in particular, concern for underlying obstructive sleep apnea. The patient is unaccompanied today. As you know, [name] is a 72 year old right-handed gentleman with an underlying medical history of reflux disease, allergic rhinitis, hypertension, irritable bowel syndrome, peripheral artery disease, hyperlipidemia, history of diverticulitis, and overweight state, who reports snoring and excessive daytime somnolence. I reviewed your office note from 12/09/2016. His Epworth sleepiness score is 4 out of 24, fatigue score is 29/63. He reports that he does not sleep well. He is married and lives with his wife. They have 3 grown children. 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 He quit smoking in 1980, alcohol consumption is typically in the form of one beer per day, sometimes wine. He drinks caffeine in the form of coffee, usually one cup in the morning. He has woken up with coughing and having to clear his throat. He has chronic drainage. He has started using a nasal saline irrigation system which may have helped some. He was given a prescription for amitriptyline back in November 2018 but did not start using this for his irritable bowel syndrome until about a week ago. He seems to sleep a little better after starting amitriptyline 10 mg at night. His wife typically sleeps in a separate bedroom. His bedtime is generally around 10. He watches TV in bed sometimes. He has nocturia about 2-3 times per average night, since starting amitriptyline maybe only once or twice. He has had occasional morning headaches, denies restless leg symptoms or leg twitching at  night, denies family history of sleep apnea as far as he knows. His main complaint as far as his sleep is nonrestorative sleep and sleep disruption, having to clear his throat in the middle of the night or cough.  His Past Medical History Is Significant For: Past Medical History:  Diagnosis Date  . Cancer ([place])   . Erectile dysfunction   . GERD without esophagitis   . Hyperlipidemia   . Hypertension   . Irritable bowel   . PAD (peripheral artery disease) ([place])   . Rhinitis     His Past Surgical History Is Significant For: Past Surgical History:  Procedure Laterality Date  . both feet bunion surgery 2001    . left hip unipolar hemiarthroplasty  1980  . nasal surgery yrs ago    . PROSTATECTOMY  2006    His Family History Is Significant For: No family history on file.  His Social History Is Significant For: Social History   Socioeconomic History  . Marital status: Married    Spouse name: None  . Number of children: None  . Years of education: None  . Highest education level: None  Social Needs  . Financial resource strain: None  . Food insecurity - worry: None  . Food insecurity - inability: None  . Transportation needs - medical: None  . Transportation needs - non-medical: None  Occupational History  . None  Tobacco Use  . Smoking status: Former Smoker    Packs/day: 2.00    Years: 22.00    Pack years: 44.00    Types: Cigarettes  Last attempt to quit: 01/24/1986    Years since quitting: 31.1  . Smokeless tobacco: Never Used  Substance and Sexual Activity  . Alcohol use: No  . Drug use: No  . Sexual activity: None  Other Topics Concern  . None  Social History Narrative  . None    His Allergies Are:  Allergies  Allergen Reactions  . Lisinopril     Other reaction(s): Cough  :   His Current Medications Are:  Outpatient Encounter Medications as of 03/01/2017  Medication Sig  . amitriptyline (ELAVIL) 10 MG tablet Take 10 mg by mouth at bedtime.  Marland Kitchen  amLODipine (NORVASC) 2.5 MG tablet Take 2.5 mg by mouth daily.  . cyclobenzaprine (FLEXERIL) 5 MG tablet Take 5 mg by mouth 3 (three) times daily as needed for muscle spasms.  . diclofenac (VOLTAREN) 75 MG EC tablet Take 75 mg by mouth 2 (two) times daily.  . fexofenadine (ALLEGRA) 180 MG tablet Take 180 mg by mouth daily.  . fluticasone (FLONASE) 50 MCG/ACT nasal spray Place 2 sprays into both nostrils daily.  Marland Kitchen glycopyrrolate (ROBINUL) 2 MG tablet Take 2 mg by mouth daily.  . hyoscyamine (ANASPAZ) 0.125 MG TBDP disintergrating tablet Place 0.125 mg under the tongue every 6 (six) hours as needed.  Marland Kitchen losartan (COZAAR) 100 MG tablet Take 100 mg by mouth daily.  . montelukast (SINGULAIR) 10 MG tablet Take 10 mg by mouth at bedtime.  Marland Kitchen omeprazole (PRILOSEC) 20 MG capsule Take 20 mg by mouth daily.  . traMADol (ULTRAM) 50 MG tablet Take 1 tablet (50 mg total) by mouth every 6 (six) hours as needed.  . triamcinolone cream (KENALOG) 0.1 % Apply 1 application topically 2 (two) times daily.  . [DISCONTINUED] chlorpheniramine-HYDROcodone (TUSSIONEX PENNKINETIC ER) 10-8 MG/5ML LQCR Take 5 mLs by mouth every 12 (twelve) hours as needed for cough.   No facility-administered encounter medications on file as of 03/01/2017.   :  Review of Systems:  Out of a complete 14 point review of systems, all are reviewed and negative with the exception of these symptoms as listed below:  Review of Systems  Neurological:       Pt presents today to discuss his sleep. Pt has never had a sleep study but does endorse snoring.  Epworth Sleepiness Scale 0= would never doze 1= slight chance of dozing 2= moderate chance of dozing 3= high chance of dozing  Sitting and reading: 1 Watching TV: 1 Sitting inactive in a public place (ex. Theater or meeting): 0 As a passenger in a car for an hour without a break: 1 Lying down to rest in the afternoon: 1 Sitting and talking to someone: 0 Sitting quietly after lunch (no  alcohol): 0 In a car, while stopped in traffic: 0 Total: 4     Objective:  Neurological Exam  Physical Exam Physical Examination:   Vitals:   03/01/17 1340  BP: (!) 160/72  Pulse: 84   General Examination: The patient is a very pleasant 72 y.o. male in no acute distress. He appears well-developed and well-nourished and well groomed.   HEENT: Normocephalic, atraumatic, pupils are equal, round and reactive to light and accommodation. He wears corrective eyeglasses. Extraocular tracking is good without limitation to gaze excursion or nystagmus noted. Normal smooth pursuit is noted. Hearing is grossly intact. Face is symmetric with normal facial animation and normal facial sensation. Speech is clear with no dysarthria noted. There is no hypophonia. There is no lip, neck/head, jaw or voice tremor.  Neck is supple with full range of passive and active motion. There are no carotid bruits on auscultation. Oropharynx exam reveals: mild mouth dryness, adequate dental hygiene and moderate airway crowding, due tsmaller airway entry, redundant soft palate and longer uvula which appears to be floppy. He does have mild pharyngeal and uvula irritation. Tonsils are the small side, Mallampati is class II. Neck circumference is 17-1/4 inches. He has a mild overbite.  Chest: Clear to auscultation without wheezing, rhonchi or crackles noted. He does have intermittent throat clearing and short coughing spells.  Heart: S1+S2+0, regular and normal without murmurs, rubs or gallops noted.   Abdomen: Soft, non-tender and non-distended with normal bowel sounds appreciated on auscultation.  Extremities: There is no pitting edema in the distal lower extremities bilaterally. Pedal pulses are intact.  Skin: Warm and dry without trophic changes noted.  Musculoskeletal: exam reveals no obvious joint deformities, tenderness or joint swelling or erythema.   Neurologically:  Mental status: The patient is awake, alert  and oriented in all 4 spheres. His immediate and remote memory, attention, language skills and fund of knowledge are appropriate. There is no evidence of aphasia, agnosia, apraxia or anomia. Speech is clear with normal prosody and enunciation. Thought process is linear. Mood is normal and affect is normal.  Cranial nerves II - XII are as described above under HEENT exam. In addition: shoulder shrug is normal with equal shoulder height noted. Motor exam: Normal bulk, strength and tone is noted. There is no drift, tremor or rebound. Fine motor skills and coordination: grossly intact.  Cerebellar testing: No dysmetria or intention tremor on finger to nose testing. Heel to shin is unremarkable bilaterally. There is no truncal or gait ataxia.  Sensory exam: intact to light touch in the upper and lower extremities.  Gait, station and balance: He stands easily. No veering to one side is noted. No leaning to one side is noted. Posture is age-appropriate and stance is narrow based. Gait shows normal stride length and normal pace. No problems turning are noted.               Assessment and Plan:   In summary, BLAIDEN WERTH is a very pleasant 72 y.o.-year old male with an underlying medical history of reflux disease, allergic rhinitis, hypertension, irritable bowel syndrome, peripheral artery disease, hyperlipidemia, history of diverticulitis, and overweight state, whose history and physical exam are concerning for obstructive sleep apnea (OSA). I had a long chat with the patient about my findings and the diagnosis of OSA, its prognosis and treatment options. We talked about medical treatments, surgical interventions and non-pharmacological approaches. I explained in particular the risks and ramifications of untreated moderate to severe OSA, especially with respect to developing cardiovascular disease down the Road, including congestive heart failure, difficult to treat hypertension, cardiac arrhythmias, or stroke.  Even type 2 diabetes has, in part, been linked to untreated OSA. Symptoms of untreated OSA include daytime sleepiness, memory problems, mood irritability and mood disorder such as depression and anxiety, lack of energy, as well as recurrent headaches, especially morning headaches. We talked about trying to maintain a healthy lifestyle in general, as well as the importance of weight control. I encouraged the patient to eat healthy, exercise daily and keep well hydrated, to keep a scheduled bedtime and wake time routine, to not skip any meals and eat healthy snacks in between meals. I advised the patient not to drive when feeling sleepy. I recommended the following at this time: sleep study  with potential positive airway pressure titration. (We will score hypopneas at 4%).   I explained the sleep test procedure to the patient and also outlined possible surgical and non-surgical treatment options of OSA, including the use of a custom-made dental device (which would require a referral to a specialist dentist or oral surgeon), upper airway surgical options, such as pillar implants, radiofrequency surgery, tongue base surgery, and UPPP (which would involve a referral to an ENT surgeon). Rarely, jaw surgery such as mandibular advancement may be considered.  I also explained the CPAP treatment option to the patient, who indicated that he would be willing to try CPAP if the need arises, but he has concerns that he may not be able to tolerate CPAP what with his chronic sinus drainage and coughing spells and having to clear his throat in the middle of the night. I explained to him that we have several ways to help make CPAP more comfortable and tolerable for our patients depending on their individual issues.  I answered all his questions today and the patient was in agreement. I would like to see him back after the sleep study is completed and encouraged him to call with any interim questions, concerns, problems or  updates.   Thank you very much for allowing me to participate in the care of this nice patient. If I can be of any further assistance to you please do not hesitate to call me at [phone].  Sincerely,   Star Age, MD, PhD

## 2017-03-01 NOTE — Patient Instructions (Addendum)

## 2017-04-04 ENCOUNTER — Ambulatory Visit (INDEPENDENT_AMBULATORY_CARE_PROVIDER_SITE_OTHER): Payer: Medicare Other | Admitting: Neurology

## 2017-04-04 DIAGNOSIS — G478 Other sleep disorders: Secondary | ICD-10-CM | POA: Diagnosis not present

## 2017-04-04 DIAGNOSIS — R51 Headache: Secondary | ICD-10-CM

## 2017-04-04 DIAGNOSIS — E663 Overweight: Secondary | ICD-10-CM

## 2017-04-04 DIAGNOSIS — R351 Nocturia: Secondary | ICD-10-CM

## 2017-04-04 DIAGNOSIS — R0683 Snoring: Secondary | ICD-10-CM

## 2017-04-04 DIAGNOSIS — G4720 Circadian rhythm sleep disorder, unspecified type: Secondary | ICD-10-CM

## 2017-04-04 DIAGNOSIS — R519 Headache, unspecified: Secondary | ICD-10-CM

## 2017-04-04 DIAGNOSIS — R05 Cough: Secondary | ICD-10-CM

## 2017-04-04 DIAGNOSIS — R058 Other specified cough: Secondary | ICD-10-CM

## 2017-04-10 NOTE — Progress Notes (Signed)
Patient referred by Mr. Anderson, NP, seen by me on 03/01/17, diagnostic PSG on 04/04/17.   Please call and notify the patient that the recent sleep study did not show any significant obstructive sleep apnea. He had mild to moderate snoring. For disturbing snoring, an oral appliance (through a qualified dentist) can be considered.  He did not sleep very well, but for this, there was no specific reason. Please remind patient to try to maintain good sleep hygiene, which means: Keep a regular sleep and wake schedule and make enough time for sleep (7 1/2 to 8 1/2 hours for the average adult), try not to exercise or have a meal within 2 hours of your bedtime, try to keep your bedroom conducive for sleep, that is, cool and dark, without light distractors such as an illuminated alarm clock, and refrain from watching TV right before sleep or in the middle of the night and do not keep the TV or radio on during the night. If a nightlight is used, have it away from the visual field. Also, try not to use or play on electronic devices at bedtime, such as your cell phone, tablet PC or laptop. If you like to read at bedtime on an electronic device, try to dim the background light as much as possible. Do not eat in the middle of the night. Keep pets away from the bedroom environment. For stress relief, try meditation, deep breathing exercises (there are many books and CDs available), a white noise machine or fan can help to diffuse other noise distractors, such as traffic noise. Do not drink alcohol before bedtime, as it can disturb sleep and cause middle of the night awakenings. Never mix alcohol and sedating medications! Avoid narcotic pain medication close to bedtime, as opioids/narcotics can suppress breathing drive and breathing effort.    Please inform patient that he can, at this point, FU with referring provider.   Thanks,  Star Age, MD, PhD [facility])

## 2017-04-10 NOTE — Procedures (Addendum)
PATIENT'S NAME:  [name], [name] DOB:      1945/06/24      MR#:    [id number]     DATE OF RECORDING: 04/04/2017 REFERRING M.D.: Tobie Lords, NP Study Performed:   Baseline Polysomnogram HISTORY: 72 year old man with a history of reflux disease, allergic rhinitis, hypertension, irritable bowel syndrome, peripheral artery disease, hyperlipidemia, history of diverticulitis, and overweight state, who reports snoring and non-restorative sleep and sleep disruption, having to clear his throat in the middle of the night or cough. The patient endorsed the Epworth Sleepiness Scale at 4 points. The patient's weight 195 pounds with a height of 68 (inches), resulting in a BMI of 29.6 kg/m2. The patient's neck circumference measured 17.25 inches.  CURRENT MEDICATIONS: Elavil, amlodipine, Flexeril, Cozaar, Ultram.   PROCEDURE:  This is a multichannel digital polysomnogram utilizing the Somnostar 11.2 system.  Electrodes and sensors were applied and monitored per AASM Specifications.   EEG, EOG, Chin and Limb EMG, were sampled at 200 Hz.  ECG, Snore and Nasal Pressure, Thermal Airflow, Respiratory Effort, CPAP Flow and Pressure, Oximetry was sampled at 50 Hz. Digital video and audio were recorded.      BASELINE STUDY  Lights Out was at 21:24 and Lights On at 05:10.  Total recording time (TRT) was 466.5 minutes, with a total sleep time (TST) of  287 minutes.   The patient's sleep latency was 79.5 minutes which is markedly delayed.  REM latency was 184 minutes, which is delayed. The sleep efficiency was 61.5 %, which is reduced.     SLEEP ARCHITECTURE: WASO (Wake after sleep onset) was 85 minutes with moderate sleep fragmentation noted. There were 21 minutes in Stage N1, 206 minutes Stage N2, 30.5 minutes Stage N3 and 29.5 minutes in Stage REM.  The percentage of Stage N1 was 7.3%, Stage N2 was 71.8%, which is increased, Stage N3 was 10.6% and Stage R (REM sleep) was 10.3%, which is reduced. The arousals were noted  as: 67 were spontaneous, 0 were associated with PLMs, 0 were associated with respiratory events.  Audio and video analysis did not show any abnormal or unusual movements, behaviors, phonations or vocalizations. The patient took 1 bathroom break. Mild to moderate snoring was noted. The EKG was in keeping with normal sinus rhythm (NSR).  RESPIRATORY ANALYSIS:  There were a total of 0 respiratory events:  0 obstructive apneas, 0 central apneas and 0 mixed apneas with a total of 0 apneas and an apnea index (AI) of 0 /hour. There were 0 hypopneas with a hypopnea index of 0 /hour. The patient also had 0 respiratory event related arousals (RERAs).      The total APNEA/HYPOPNEA INDEX (AHI) was 0/hour and the total RESPIRATORY DISTURBANCE INDEX was 0 /hour.  0 events occurred in REM sleep and 0 events in NREM. The REM AHI was 0 /hour, versus a non-REM AHI of 0. The patient spent 75 minutes of total sleep time in the supine position and 212 minutes in non-supine.. The supine AHI was 0.0 versus a non-supine AHI of 0.0.  OXYGEN SATURATION & C02:  The Wake baseline 02 saturation was 96%, with the lowest being 88%. Time spent below 89% saturation equaled 1 minutes.  PERIODIC LIMB MOVEMENTS: The patient had a total of 0 Periodic Limb Movements.  The Periodic Limb Movement (PLM) index was 0 and the PLM Arousal index was 0/hour.  Post-study, the patient indicated that sleep was the same as usual.   IMPRESSION:  1. Primary Snoring 2.  Dysfunctions associated with sleep stages or arousal from sleep  RECOMMENDATIONS:  1. This study does not demonstrate any significant obstructive or central sleep disordered breathing with the exception of mild to moderate snoring. For disturbing snoring, an oral appliance (through a qualified dentist) can be considered.  2. This study does not support an intrinsic sleep disorder as a cause of the patient's symptoms. Other causes, including circadian rhythm disturbances, an  underlying mood disorder, medication effect and/or an underlying medical problem cannot be ruled out. 3. This study shows sleep fragmentation and abnormal sleep stage percentages; these are nonspecific findings and per se do not signify an intrinsic sleep disorder or a cause for the patient's sleep-related symptoms. Causes include (but are not limited to) the first night effect of the sleep study, circadian rhythm disturbances, medication effect or an underlying mood disorder or medical problem.  4. The patient should be cautioned not to drive, work at heights, or operate dangerous or heavy equipment when tired or sleepy. Review and reiteration of good sleep hygiene measures should be pursued with any patient. 5. The patient can follow-up with his referring provider, who will be notified of the test results.  I certify that I have reviewed the entire raw data recording prior to the issuance of this report in accordance with the Standards of Accreditation of the American Academy of Sleep Medicine (AASM)    Star Age, MD, PhD Diplomat, American Board of Psychiatry and Neurology (Neurology and Sleep Medicine)

## 2017-04-24 ENCOUNTER — Telehealth: Payer: Self-pay

## 2017-04-24 NOTE — Progress Notes (Signed)
FYI, data corrected. Shirlean Mylar will call.  Michel Bickers

## 2017-04-24 NOTE — Telephone Encounter (Signed)
Spoke with patient and explained that sleeps tudy showed no sleep apnea. He is advised to follow up with his referring provider which is Clydia Llano. If he has any further questions he can call us back. He understood.

## 2017-04-24 NOTE — Telephone Encounter (Signed)
Noted  

## 2017-09-01 ENCOUNTER — Other Ambulatory Visit: Payer: Self-pay | Admitting: Student

## 2017-09-01 DIAGNOSIS — M25551 Pain in right hip: Secondary | ICD-10-CM

## 2017-09-12 ENCOUNTER — Ambulatory Visit
Admission: RE | Admit: 2017-09-12 | Discharge: 2017-09-12 | Disposition: A | Discharge status: Home / Self Care | Payer: Medicare Other | Source: Ambulatory Visit | Attending: Student | Admitting: Student

## 2017-09-12 DIAGNOSIS — M25551 Pain in right hip: Secondary | ICD-10-CM

## 2017-09-12 MED ORDER — IOPAMIDOL (ISOVUE-M 200) INJECTION 41%
1.0000 mL | Freq: Once | INTRAMUSCULAR | Status: AC
  Administered 2017-09-12: 1 mL via INTRA_ARTICULAR

## 2017-09-12 MED ORDER — METHYLPREDNISOLONE ACETATE 40 MG/ML INJ SUSP (RADIOLOG
120.0000 mg | Freq: Once | INTRAMUSCULAR | Status: AC
  Administered 2017-09-12: 120 mg via INTRA_ARTICULAR

## 2017-12-05 ENCOUNTER — Other Ambulatory Visit: Payer: Self-pay | Admitting: Orthopedic Surgery

## 2017-12-05 DIAGNOSIS — M25551 Pain in right hip: Secondary | ICD-10-CM

## 2017-12-14 ENCOUNTER — Ambulatory Visit
Admission: RE | Admit: 2017-12-14 | Discharge: 2017-12-14 | Disposition: A | Discharge status: Home / Self Care | Payer: Medicare Other | Source: Ambulatory Visit | Attending: Orthopedic Surgery | Admitting: Orthopedic Surgery

## 2017-12-14 DIAGNOSIS — M25551 Pain in right hip: Secondary | ICD-10-CM

## 2017-12-14 MED ORDER — METHYLPREDNISOLONE ACETATE 40 MG/ML INJ SUSP (RADIOLOG
120.0000 mg | Freq: Once | INTRAMUSCULAR | Status: AC
  Administered 2017-12-14: 120 mg via INTRALESIONAL

## 2017-12-14 MED ORDER — IOPAMIDOL (ISOVUE-M 200) INJECTION 41%
1.0000 mL | Freq: Once | INTRAMUSCULAR | Status: AC
  Administered 2017-12-14: 1 mL via INTRA_ARTICULAR

## 2018-05-30 ENCOUNTER — Inpatient Hospital Stay: Admit: 2018-05-30 | Payer: Medicare Other | Admitting: Orthopedic Surgery

## 2018-05-30 SURGERY — ARTHROPLASTY, HIP, TOTAL, ANTERIOR APPROACH
Anesthesia: Choice | Laterality: Right

## 2018-06-26 NOTE — H&P (Signed)
TOTAL HIP ADMISSION H&P  Patient is admitted for right total hip arthroplasty.  Subjective:  Chief Complaint: right hip pain  HPI: [name], 73 y.o. male, has a history of pain and functional disability in the right hip(s) due to arthritis and avascular necrosis and patient has failed non-surgical conservative treatments for greater than 12 weeks to include corticosteriod injections and activity modification.  Onset of symptoms was gradual starting 1 years ago with gradually worsening course since that time.The patient noted no past surgery on the right hip(s).  Patient currently rates pain in the right hip at 5 out of 10 with activity. Patient has worsening of pain with activity and weight bearing and pain that interfers with activities of daily living. Patient has evidence of revision prosthesis on the left to be in excellent position with no periprosthetic abnormalities. On the right, he has significant joint space narrowing as well as a crescent sign consistent with osteonecrosis.  by imaging studies. This condition presents safety issues increasing the risk of falls.  There is no current active infection.  Patient Active Problem List   Diagnosis Date Noted  . Chronic cough 07/01/2013  . 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 Cancer ([place])   . Erectile dysfunction   . GERD without esophagitis   . Hyperlipidemia   . Hypertension   . Irritable bowel   . PAD (peripheral artery disease) ([place])   . Rhinitis     Past Surgical History:  Procedure Laterality Date  . both feet bunion surgery 2001    . left hip unipolar hemiarthroplasty  1980  . nasal surgery yrs ago    . PROSTATECTOMY  2006    No current facility-administered medications for this encounter.    Current Outpatient Medications  Medication Sig Dispense Refill Last Dose  . amitriptyline (ELAVIL) 10 MG tablet Take 10 mg by mouth at bedtime.   Taking  . amLODipine  (NORVASC) 2.5 MG tablet Take 2.5 mg by mouth daily.   Taking  . cyclobenzaprine (FLEXERIL) 5 MG tablet Take 5 mg by mouth 3 (three) times daily as needed for muscle spasms.   Taking  . diclofenac (VOLTAREN) 75 MG EC tablet Take 75 mg by mouth 2 (two) times daily.   Taking  . fexofenadine (ALLEGRA) 180 MG tablet Take 180 mg by mouth daily.   Taking  . fluticasone (FLONASE) 50 MCG/ACT nasal spray Place 2 sprays into both nostrils daily. 16 g 2 Taking  . glycopyrrolate (ROBINUL) 2 MG tablet Take 2 mg by mouth daily.   Taking  . hyoscyamine (ANASPAZ) 0.125 MG TBDP disintergrating tablet Place 0.125 mg under the tongue every 6 (six) hours as needed.   Taking  . losartan (COZAAR) 100 MG tablet Take 100 mg by mouth daily.   Taking  . montelukast (SINGULAIR) 10 MG tablet Take 10 mg by mouth at bedtime.   Taking  . omeprazole (PRILOSEC) 20 MG capsule Take 20 mg by mouth daily.   Taking  . traMADol (ULTRAM) 50 MG tablet Take 1 tablet (50 mg total) by mouth every 6 (six) hours as needed. 45 tablet 3 Taking  . triamcinolone cream (KENALOG) 0.1 % Apply 1 application topically 2 (two) times daily.   Taking   Allergies  Allergen Reactions  . Lisinopril     Other reaction(s): Cough    Social History   Tobacco Use  . Smoking status: Former Smoker    Packs/day: 2.00  Years: 22.00    Pack years: 44.00    Types: Cigarettes    Last attempt to quit: 01/24/1986    Years since quitting: 32.4  . Smokeless tobacco: Never Used  Substance Use Topics  . Alcohol use: No    No family history on file.   ROS Constitutional: Constitutional: no significant weight gain or loss and no fever. HEENT: Eyes: no vision change. Cardiovascular: Cardiovascular: no palpitations or chest pain. Respiratory: Respiratory: no cough or shortness of breath and No COPD. Gastrointestinal: Gastrointestinal: no vomiting or diarrhea and not vomiting blood. Genitourinary: Genitourinary: no blood in urine or difficulty urinating.  Musculoskeletal: Musculoskeletal: no joint pain or swelling in Joints. Integumentary: Skin: no rashes or varicose veins. Neurologic: Neurologic: no numbness, seizures, dizziness, or difficulty with balance. Endocrine: temperature intolerance (normal) to heat. Hematologic/Lymphatic: Hematologic/Lymphatic no bruising or swollen glands. Objective:  Physical Exam Patient is a 73 year old male.  Well nourished and well developed. General: Alert and oriented x3, cooperative and pleasant, no acute distress. Head: normocephalic, atraumatic, neck supple. Eyes: EOMI. Respiratory: breath sounds clear in all fields, no wheezing, rales, or rhonchi. Cardiovascular: Regular rate and rhythm, no murmurs, gallops or rubs. Abdomen: non-tender to palpation and soft, normoactive bowel sounds. Musculoskeletal: Right Hip Exam: ROM: Flexion to 100, Internal Rotation 0, External Rotation 10, and abduction 30. There is no tenderness over the greater trochanter bursa. There is no pain on provocative testing of the hip.  Left Hip Exam: ROM: Flexion to 110, Internal Rotation 20, External Rotation 30, and abduction 30 without discomfort. There is no tenderness over the greater trochanter bursa. There is no pain on provocative testing of the hip.  Calves soft and nontender. Motor function intact in LE. Strength 5/5 LE bilaterally. Neuro: Distal pulses 2+. Sensation to light touch intact in LE.  Well-developed male alert and oriented in no apparent distress.  Vital signs in last 24 hours: BP: 145/78 Labs:   Estimated body mass index is 29.65 kg/m as calculated from the following:   Height as of 03/01/17: 5\' 8"  (1.727 m).   Weight as of 03/01/17: 88.5 kg.   Imaging Review Plain radiographs demonstrate mild degenerative joint disease of the right hip(s). The bone quality appears to be adequate for age and reported activity level.      Assessment/Plan:  End stage arthritis and osteonecrosis, right  hip(s)  The patient history, physical examination, clinical judgement of the provider and imaging studies are consistent with end stage degenerative joint disease of the right hip(s) and total hip arthroplasty is deemed medically necessary. The treatment options including medical management, injection therapy, arthroscopy and arthroplasty were discussed at length. The risks and benefits of total hip arthroplasty were presented and reviewed. The risks due to aseptic loosening, infection, stiffness, dislocation/subluxation,  thromboembolic complications and other imponderables were discussed.  The patient acknowledged the explanation, agreed to proceed with the plan and consent was signed. Patient is being admitted for inpatient treatment for surgery, pain control, PT, OT, prophylactic antibiotics, VTE prophylaxis, progressive ambulation and ADL's and discharge planning.The patient is planning to be discharged home  Therapy Plans: HEP Disposition: Home with wife Planned DVT Prophylaxis: aspirin 325mg  BID DME needed: none PCP: Tobie Lords TXA: IV Allergies: NKDA Anesthesia Concerns: none BMI: 30.2  Other: Hx prostate cancer 2006, s/p prostatectomy.  Patient's anticipated LOS is less than 2 midnights, meeting these requirements: - Lives within 1 hour of care - Has a competent adult at home to recover with post-op recover - NO  history of  - Chronic pain requiring opiods  - Diabetes  - Coronary Artery Disease  - Heart failure  - Heart attack  - Stroke  - DVT/VTE  - Cardiac arrhythmia  - Respiratory Failure/COPD  - Renal failure  - Anemia  - Advanced Liver disease  - Patient was instructed on what medications to stop prior to surgery. - Follow-up visit in 2 weeks with Dr. Wynelle Link - Begin physical therapy following surgery - Pre-operative lab work as pre-surgical testing - Prescriptions will be provided in hospital at time of discharge  Griffith Citron, PA-C Orthopedic Surgery  [facility] [place]

## 2018-07-04 ENCOUNTER — Encounter (HOSPITAL_COMMUNITY): Payer: Self-pay

## 2018-07-04 NOTE — Patient Instructions (Addendum)
[name]    Your procedure is scheduled on: 07-11-2018   Report to [facility] Main  Entrance               Report to admitting at 1245 PM    YOU NEED TO HAVE A COVID 19 TEST ON___6-13-20____ @__1100am_____ , THIS TEST MUST BE DONE BEFORE SURGERY, COME TO [place].    Call this number if you have problems the morning of surgery [phone]    Remember: . BRUSH YOUR TEETH MORNING OF SURGERY AND RINSE YOUR MOUTH OUT, NO CHEWING GUM CANDY OR MINTS.   NO SOLID FOOD AFTER MIDNIGHT THE NIGHT PRIOR TO SURGERY. NOTHING BY MOUTH EXCEPT CLEAR LIQUIDS UNTIL 900 AM. . PLEASE FINISH ENSURE DRINK PER SURGEON ORDER 3 HOURS PRIOR TO SCHEDULED SURGERY TIME WHICH NEEDS TO BE COMPLETED AT 430 AM    CLEAR LIQUID DIET   Foods Allowed                                                                     Foods Excluded  Coffee and tea, regular and decaf                             liquids that you cannot  Plain Jell-O in any flavor                                             see through such as: Fruit ices (not with fruit pulp)                                     milk, soups, orange juice  Iced Popsicles                                    All solid food Carbonated beverages, regular and diet                                    Cranberry, grape and apple juices Sports drinks like Gatorade Lightly seasoned clear broth or consume(fat free) Sugar, honey syrup  Sample Menu Breakfast                                Lunch                                     Supper Cranberry juice                    Beef broth  Chicken broth Jell-O                                     Grape juice                           Apple juice Coffee or tea                        Jell-O                                      Popsicle                                                Coffee or tea                        Coffee or  tea  _____________________________________________________________________    Take these medicines the morning of surgery with A SIP OF WATER: AMLODIPINE (NORVASC)                               You may not have any metal on your body including hair pins and              piercings  Do not wear jewelry, make-up, lotions, powders or perfumes, deodorant             Do not wear nail polish.  Do not shave  48 hours prior to surgery.              Men may shave face and neck.   Do not bring valuables to the hospital. [place].  Contacts, dentures or bridgework may not be worn into surgery.  Leave suitcase in the car. After surgery it may be brought to your room.   _____________________________________________________________________             [facility] - Preparing for Surgery Before surgery, you can play an important role.  Because skin is not sterile, your skin needs to be as free of germs as possible.  You can reduce the number of germs on your skin by washing with CHG (chlorahexidine gluconate) soap before surgery.  CHG is an antiseptic cleaner which kills germs and bonds with the skin to continue killing germs even after washing. Please DO NOT use if you have an allergy to CHG or antibacterial soaps.  If your skin becomes reddened/irritated stop using the CHG and inform your nurse when you arrive at Short Stay. Do not shave (including legs and underarms) for at least 48 hours prior to the first CHG shower.  You may shave your face/neck. Please follow these instructions carefully:  1.  Shower with CHG Soap the night before surgery and the  morning of Surgery.  2.  If you choose to wash your hair, wash your hair first as usual with your  normal  shampoo.  3.  After you shampoo, rinse your hair and body  thoroughly to remove the  shampoo.                           4.  Use CHG as you would any other liquid soap.  You can apply chg directly  to  the skin and wash                       Gently with a scrungie or clean washcloth.  5.  Apply the CHG Soap to your body ONLY FROM THE NECK DOWN.   Do not use on face/ open                           Wound or open sores. Avoid contact with eyes, ears mouth and genitals (private parts).                       Wash face,  Genitals (private parts) with your normal soap.             6.  Wash thoroughly, paying special attention to the area where your surgery  will be performed.  7.  Thoroughly rinse your body with warm water from the neck down.  8.  DO NOT shower/wash with your normal soap after using and rinsing off  the CHG Soap.                9.  Pat yourself dry with a clean towel.            10.  Wear clean pajamas.            11.  Place clean sheets on your bed the night of your first shower and do not  sleep with pets. Day of Surgery : Do not apply any lotions/deodorants the morning of surgery.  Please wear clean clothes to the hospital/surgery center.  FAILURE TO FOLLOW THESE INSTRUCTIONS MAY RESULT IN THE CANCELLATION OF YOUR SURGERY PATIENT SIGNATURE_________________________________  NURSE SIGNATURE__________________________________  ________________________________________________________________________   [name]  An incentive spirometer is a tool that can help keep your lungs clear and active. This tool measures how well you are filling your lungs with each breath. Taking long deep breaths may help reverse or decrease the chance of developing breathing (pulmonary) problems (especially infection) following:  A long period of time when you are unable to move or be active. BEFORE THE PROCEDURE   If the spirometer includes an indicator to show your best effort, your nurse or respiratory therapist will set it to a desired goal.  If possible, sit up straight or lean slightly forward. Try not to slouch.  Hold the incentive spirometer in an upright position. INSTRUCTIONS  FOR USE  1. Sit on the edge of your bed if possible, or sit up as far as you can in bed or on a chair. 2. Hold the incentive spirometer in an upright position. 3. Breathe out normally. 4. Place the mouthpiece in your mouth and seal your lips tightly around it. 5. Breathe in slowly and as deeply as possible, raising the piston or the ball toward the top of the column. 6. Hold your breath for 3-5 seconds or for as long as possible. Allow the piston or ball to fall to the bottom of the column. 7. Remove the mouthpiece from your mouth and breathe out normally. 8. Rest for a few seconds and repeat Steps 1 through  7 at least 10 times every 1-2 hours when you are awake. Take your time and take a few normal breaths between deep breaths. 9. The spirometer may include an indicator to show your best effort. Use the indicator as a goal to work toward during each repetition. 10. After each set of 10 deep breaths, practice coughing to be sure your lungs are clear. If you have an incision (the cut made at the time of surgery), support your incision when coughing by placing a pillow or rolled up towels firmly against it. Once you are able to get out of bed, walk around indoors and cough well. You may stop using the incentive spirometer when instructed by your caregiver.  RISKS AND COMPLICATIONS  Take your time so you do not get dizzy or light-headed.  If you are in pain, you may need to take or ask for pain medication before doing incentive spirometry. It is harder to take a deep breath if you are having pain. AFTER USE  Rest and breathe slowly and easily.  It can be helpful to keep track of a log of your progress. Your caregiver can provide you with a simple table to help with this. If you are using the spirometer at home, follow these instructions: [place] IF:   You are having difficultly using the spirometer.  You have trouble using the spirometer as often as instructed.  Your pain  medication is not giving enough relief while using the spirometer.  You develop fever of 100.5 F (38.1 C) or higher. SEEK IMMEDIATE MEDICAL CARE IF:   You cough up bloody sputum that had not been present before.  You develop fever of 102 F (38.9 C) or greater.  You develop worsening pain at or near the incision site. MAKE SURE YOU:   Understand these instructions.  Will watch your condition.  Will get help right away if you are not doing well or get worse. Document Released: 05/23/2006 Document Revised: 04/04/2011 Document Reviewed: 07/24/2006 Exit[employer] Patient Information 2014 [employer], [place].   ________________________________________________________________________  WHAT IS A BLOOD TRANSFUSION? Blood Transfusion Information  A transfusion is the replacement of blood or some of its parts. Blood is made up of multiple cells which provide different functions.  Red blood cells carry oxygen and are used for blood loss replacement.  White blood cells fight against infection.  Platelets control bleeding.  Plasma helps clot blood.  Other blood products are available for specialized needs, such as hemophilia or other clotting disorders. BEFORE THE TRANSFUSION  Who gives blood for transfusions?   Healthy volunteers who are fully evaluated to make sure their blood is safe. This is blood bank blood. Transfusion therapy is the safest it has ever been in the practice of medicine. Before blood is taken from a donor, a complete history is taken to make sure that person has no history of diseases nor engages in risky social behavior (examples are intravenous drug use or sexual activity with multiple partners). The donor's travel history is screened to minimize risk of transmitting infections, such as malaria. The donated blood is tested for signs of infectious diseases, such as HIV and hepatitis. The blood is then tested to be sure it is compatible with you in order to minimize the  chance of a transfusion reaction. If you or a relative donates blood, this is often done in anticipation of surgery and is not appropriate for emergency situations. It takes many days to process the donated blood. RISKS AND COMPLICATIONS  Although transfusion therapy is very safe and saves many lives, the main dangers of transfusion include:   Getting an infectious disease.  Developing a transfusion reaction. This is an allergic reaction to something in the blood you were given. Every precaution is taken to prevent this. The decision to have a blood transfusion has been considered carefully by your caregiver before blood is given. Blood is not given unless the benefits outweigh the risks. AFTER THE TRANSFUSION  Right after receiving a blood transfusion, you will usually feel much better and more energetic. This is especially true if your red blood cells have gotten low (anemic). The transfusion raises the level of the red blood cells which carry oxygen, and this usually causes an energy increase.  The nurse administering the transfusion will monitor you carefully for complications. HOME CARE INSTRUCTIONS  No special instructions are needed after a transfusion. You may find your energy is better. Speak with your caregiver about any limitations on activity for underlying diseases you may have. SEEK MEDICAL CARE IF:   Your condition is not improving after your transfusion.  You develop redness or irritation at the intravenous (IV) site. SEEK IMMEDIATE MEDICAL CARE IF:  Any of the following symptoms occur over the next 12 hours:  Shaking chills.  You have a temperature by mouth above 102 F (38.9 C), not controlled by medicine.  Chest, back, or muscle pain.  People around you feel you are not acting correctly or are confused.  Shortness of breath or difficulty breathing.  Dizziness and fainting.  You get a rash or develop hives.  You have a decrease in urine output.  Your urine  turns a dark color or changes to pink, red, or brown. Any of the following symptoms occur over the next 10 days:  You have a temperature by mouth above 102 F (38.9 C), not controlled by medicine.  Shortness of breath.  Weakness after normal activity.  The white part of the eye turns yellow (jaundice).  You have a decrease in the amount of urine or are urinating less often.  Your urine turns a dark color or changes to pink, red, or brown. Document Released: 01/08/2000 Document Revised: 04/04/2011 Document Reviewed: 08/27/2007 [facility] Patient Information 2014 [place], [place].  _______________________________________________________________________

## 2018-07-05 ENCOUNTER — Encounter (HOSPITAL_COMMUNITY)
Admission: RE | Admit: 2018-07-05 | Discharge: 2018-07-05 | Disposition: A | Discharge status: Home / Self Care | Payer: Medicare Other | Source: Ambulatory Visit | Attending: Orthopedic Surgery | Admitting: Orthopedic Surgery

## 2018-07-05 ENCOUNTER — Encounter (HOSPITAL_COMMUNITY): Payer: Self-pay

## 2018-07-05 ENCOUNTER — Other Ambulatory Visit: Payer: Self-pay

## 2018-07-05 DIAGNOSIS — Z1159 Encounter for screening for other viral diseases: Secondary | ICD-10-CM | POA: Insufficient documentation

## 2018-07-05 DIAGNOSIS — Z01818 Encounter for other preprocedural examination: Secondary | ICD-10-CM | POA: Diagnosis not present

## 2018-07-05 DIAGNOSIS — M1611 Unilateral primary osteoarthritis, right hip: Secondary | ICD-10-CM | POA: Diagnosis not present

## 2018-07-05 HISTORY — DX: Personal history of urinary calculi: Z87.442

## 2018-07-05 HISTORY — DX: Pneumonia, unspecified organism: J18.9

## 2018-07-05 LAB — COMPREHENSIVE METABOLIC PANEL
ALT: 17 U/L (ref 0–44)
AST: 24 U/L (ref 15–41)
Albumin: 4 g/dL (ref 3.5–5.0)
Alkaline Phosphatase: 58 U/L (ref 38–126)
Anion gap: 11 (ref 5–15)
BUN: 13 mg/dL (ref 8–23)
CO2: 25 mmol/L (ref 22–32)
Calcium: 9.9 mg/dL (ref 8.9–10.3)
Chloride: 99 mmol/L (ref 98–111)
Creatinine, Ser: 0.93 mg/dL (ref 0.61–1.24)
GFR calc Af Amer: >60 mL/min (ref >60)
GFR calc non Af Amer: >60 mL/min (ref >60)
Glucose, Bld: 95 mg/dL (ref 70–99)
Potassium: 4 mmol/L (ref 3.5–5.1)
Sodium: 135 mmol/L (ref 135–145)
Total Bilirubin: 0.8 mg/dL (ref 0.3–1.2)
Total Protein: 7.5 g/dL (ref 6.5–8.1)

## 2018-07-05 LAB — CBC
HCT: 42.2 % (ref 39.0–52.0)
Hemoglobin: 14.5 g/dL (ref 13.0–17.0)
MCH: 32.7 pg (ref 26.0–34.0)
MCHC: 34.4 g/dL (ref 30.0–36.0)
MCV: 95 fL (ref 80.0–100.0)
Platelets: 286 10*3/uL (ref 150–400)
RBC: 4.44 MIL/uL (ref 4.22–5.81)
RDW: 12.2 % (ref 11.5–15.5)
WBC: 6.6 10*3/uL (ref 4.0–10.5)
nRBC: 0 % (ref 0.0–0.2)

## 2018-07-05 LAB — SURGICAL PCR SCREEN
MRSA, PCR: NEGATIVE
Staphylococcus aureus: NEGATIVE

## 2018-07-05 LAB — APTT: aPTT: 33 seconds (ref 24–36)

## 2018-07-05 LAB — PROTIME-INR
INR: 1 (ref 0.8–1.2)
Prothrombin Time: 13.3 seconds (ref 11.4–15.2)

## 2018-07-07 ENCOUNTER — Other Ambulatory Visit (HOSPITAL_COMMUNITY)
Admission: RE | Admit: 2018-07-07 | Discharge: 2018-07-07 | Disposition: A | Discharge status: Home / Self Care | Payer: Medicare Other | Source: Ambulatory Visit | Attending: Orthopedic Surgery | Admitting: Orthopedic Surgery

## 2018-07-07 DIAGNOSIS — Z01818 Encounter for other preprocedural examination: Secondary | ICD-10-CM | POA: Diagnosis not present

## 2018-07-08 LAB — NOVEL CORONAVIRUS, NAA (HOSP ORDER, SEND-OUT TO REF LAB; TAT 18-24 HRS): SARS-CoV-2, NAA: NOT DETECTED

## 2018-07-10 NOTE — Progress Notes (Signed)
SPOKE W/  _patient     SCREENING SYMPTOMS OF COVID 19:   COUGH--no  RUNNY NOSE--- no  SORE THROAT---no  NASAL CONGESTION----no  SNEEZING----no  SHORTNESS OF BREATH---no  DIFFICULTY BREATHING---no  TEMP >100.0 -----no  UNEXPLAINED BODY ACHES------no  CHILLS --------no   HEADACHES ---------no  LOSS OF SMELL/ TASTE --------no    HAVE YOU OR ANY FAMILY MEMBER TRAVELLED PAST 14 DAYS OUT OF THE   COUNTY---no STATE----no COUNTRY----no  HAVE YOU OR ANY FAMILY MEMBER BEEN EXPOSED TO ANYONE WITH COVID 19? no    

## 2018-07-11 ENCOUNTER — Other Ambulatory Visit: Payer: Self-pay

## 2018-07-11 ENCOUNTER — Inpatient Hospital Stay (HOSPITAL_COMMUNITY): Payer: Medicare Other | Admitting: Anesthesiology

## 2018-07-11 ENCOUNTER — Encounter (HOSPITAL_COMMUNITY): Payer: Self-pay

## 2018-07-11 ENCOUNTER — Inpatient Hospital Stay (HOSPITAL_COMMUNITY): Payer: Medicare Other

## 2018-07-11 ENCOUNTER — Observation Stay (HOSPITAL_COMMUNITY)
Admission: RE | Admit: 2018-07-11 | Discharge: 2018-07-12 | Disposition: A | Discharge status: Home / Self Care | Payer: Medicare Other | Attending: Orthopedic Surgery | Admitting: Orthopedic Surgery

## 2018-07-11 ENCOUNTER — Telehealth (HOSPITAL_COMMUNITY): Payer: Self-pay | Admitting: *Deleted

## 2018-07-11 ENCOUNTER — Encounter (HOSPITAL_COMMUNITY)
Admission: RE | Disposition: A | Discharge status: Home / Self Care | Payer: Self-pay | Source: Home / Self Care | Attending: Orthopedic Surgery

## 2018-07-11 ENCOUNTER — Inpatient Hospital Stay (HOSPITAL_COMMUNITY): Payer: Medicare Other | Admitting: Physician Assistant

## 2018-07-11 DIAGNOSIS — M169 Osteoarthritis of hip, unspecified: Secondary | ICD-10-CM | POA: Diagnosis present

## 2018-07-11 DIAGNOSIS — E785 Hyperlipidemia, unspecified: Secondary | ICD-10-CM | POA: Insufficient documentation

## 2018-07-11 DIAGNOSIS — Z7982 Long term (current) use of aspirin: Secondary | ICD-10-CM | POA: Insufficient documentation

## 2018-07-11 DIAGNOSIS — Z87442 Personal history of urinary calculi: Secondary | ICD-10-CM | POA: Diagnosis not present

## 2018-07-11 DIAGNOSIS — Z8546 Personal history of malignant neoplasm of prostate: Secondary | ICD-10-CM | POA: Diagnosis not present

## 2018-07-11 DIAGNOSIS — K589 Irritable bowel syndrome without diarrhea: Secondary | ICD-10-CM | POA: Diagnosis not present

## 2018-07-11 DIAGNOSIS — Z791 Long term (current) use of non-steroidal anti-inflammatories (NSAID): Secondary | ICD-10-CM | POA: Diagnosis not present

## 2018-07-11 DIAGNOSIS — Z79899 Other long term (current) drug therapy: Secondary | ICD-10-CM | POA: Insufficient documentation

## 2018-07-11 DIAGNOSIS — G8929 Other chronic pain: Secondary | ICD-10-CM | POA: Insufficient documentation

## 2018-07-11 DIAGNOSIS — M1611 Unilateral primary osteoarthritis, right hip: Principal | ICD-10-CM | POA: Insufficient documentation

## 2018-07-11 DIAGNOSIS — Z87891 Personal history of nicotine dependence: Secondary | ICD-10-CM | POA: Diagnosis not present

## 2018-07-11 DIAGNOSIS — K219 Gastro-esophageal reflux disease without esophagitis: Secondary | ICD-10-CM | POA: Insufficient documentation

## 2018-07-11 DIAGNOSIS — Z96649 Presence of unspecified artificial hip joint: Secondary | ICD-10-CM

## 2018-07-11 DIAGNOSIS — I10 Essential (primary) hypertension: Secondary | ICD-10-CM | POA: Insufficient documentation

## 2018-07-11 DIAGNOSIS — Z419 Encounter for procedure for purposes other than remedying health state, unspecified: Secondary | ICD-10-CM

## 2018-07-11 DIAGNOSIS — I739 Peripheral vascular disease, unspecified: Secondary | ICD-10-CM | POA: Diagnosis not present

## 2018-07-11 HISTORY — PX: TOTAL HIP ARTHROPLASTY: SHX124

## 2018-07-11 LAB — TYPE AND SCREEN
ABO/RH(D): O POS
Antibody Screen: NEGATIVE

## 2018-07-11 SURGERY — ARTHROPLASTY, HIP, TOTAL, ANTERIOR APPROACH
Anesthesia: Spinal | Site: Hip | Laterality: Right

## 2018-07-11 MED ORDER — DOCUSATE SODIUM 100 MG PO CAPS
100.0000 mg | ORAL_CAPSULE | Freq: Two times a day (BID) | ORAL | Status: DC
  Administered 2018-07-11 – 2018-07-12 (×2): 100 mg via ORAL
  Filled 2018-07-11 (×2): qty 1

## 2018-07-11 MED ORDER — BUPIVACAINE IN DEXTROSE 0.75-8.25 % IT SOLN
INTRATHECAL | Status: DC | PRN
  Administered 2018-07-11: 1.8 mL via INTRATHECAL

## 2018-07-11 MED ORDER — METOCLOPRAMIDE HCL 5 MG/ML IJ SOLN: 5.0000 mg | Freq: Three times a day (TID) | INTRAMUSCULAR | Status: DC | PRN

## 2018-07-11 MED ORDER — PANTOPRAZOLE SODIUM 40 MG PO TBEC
40.0000 mg | DELAYED_RELEASE_TABLET | Freq: Every day | ORAL | Status: DC
  Administered 2018-07-12: 40 mg via ORAL
  Filled 2018-07-11: qty 1

## 2018-07-11 MED ORDER — HYOSCYAMINE SULFATE 0.125 MG PO TBDP
0.1250 mg | ORAL_TABLET | Freq: Four times a day (QID) | ORAL | Status: DC | PRN
  Filled 2018-07-11: qty 1

## 2018-07-11 MED ORDER — CHLORHEXIDINE GLUCONATE 4 % EX LIQD: 60.0000 mL | Freq: Once | CUTANEOUS | Status: DC

## 2018-07-11 MED ORDER — BISACODYL 10 MG RE SUPP: 10.0000 mg | Freq: Every day | RECTAL | Status: DC | PRN

## 2018-07-11 MED ORDER — PROPOFOL 10 MG/ML IV BOLUS
INTRAVENOUS | Status: AC
  Filled 2018-07-11: qty 20

## 2018-07-11 MED ORDER — PROPOFOL 500 MG/50ML IV EMUL
INTRAVENOUS | Status: DC | PRN
  Administered 2018-07-11: 75 ug/kg/min via INTRAVENOUS

## 2018-07-11 MED ORDER — FLEET ENEMA 7-19 GM/118ML RE ENEM: 1.0000 | ENEMA | Freq: Once | RECTAL | Status: DC | PRN

## 2018-07-11 MED ORDER — DEXAMETHASONE SODIUM PHOSPHATE 10 MG/ML IJ SOLN
10.0000 mg | Freq: Once | INTRAMUSCULAR | Status: AC
  Administered 2018-07-12: 10 mg via INTRAVENOUS
  Filled 2018-07-11: qty 1

## 2018-07-11 MED ORDER — LOSARTAN POTASSIUM 50 MG PO TABS
100.0000 mg | ORAL_TABLET | Freq: Every day | ORAL | Status: DC
  Administered 2018-07-12: 100 mg via ORAL
  Filled 2018-07-11: qty 2

## 2018-07-11 MED ORDER — HYDROCODONE-ACETAMINOPHEN 5-325 MG PO TABS: 1.0000 | ORAL_TABLET | ORAL | Status: DC | PRN

## 2018-07-11 MED ORDER — ONDANSETRON HCL 4 MG/2ML IJ SOLN
INTRAMUSCULAR | Status: DC | PRN
  Administered 2018-07-11: 4 mg via INTRAVENOUS

## 2018-07-11 MED ORDER — CEFAZOLIN SODIUM-DEXTROSE 2-4 GM/100ML-% IV SOLN
2.0000 g | Freq: Four times a day (QID) | INTRAVENOUS | Status: AC
  Administered 2018-07-11 – 2018-07-12 (×2): 2 g via INTRAVENOUS
  Filled 2018-07-11 (×2): qty 100

## 2018-07-11 MED ORDER — PHENYLEPHRINE 40 MCG/ML (10ML) SYRINGE FOR IV PUSH (FOR BLOOD PRESSURE SUPPORT)
PREFILLED_SYRINGE | INTRAVENOUS | Status: DC | PRN
  Administered 2018-07-11: 120 ug via INTRAVENOUS
  Administered 2018-07-11 (×3): 80 ug via INTRAVENOUS

## 2018-07-11 MED ORDER — DEXAMETHASONE SODIUM PHOSPHATE 10 MG/ML IJ SOLN
INTRAMUSCULAR | Status: AC
  Filled 2018-07-11: qty 1

## 2018-07-11 MED ORDER — SODIUM CHLORIDE 0.9 % IV SOLN
INTRAVENOUS | Status: DC
  Administered 2018-07-11: 21:00:00 via INTRAVENOUS

## 2018-07-11 MED ORDER — 0.9 % SODIUM CHLORIDE (POUR BTL) OPTIME
TOPICAL | Status: DC | PRN
  Administered 2018-07-11: 1000 mL

## 2018-07-11 MED ORDER — PHENYLEPHRINE 40 MCG/ML (10ML) SYRINGE FOR IV PUSH (FOR BLOOD PRESSURE SUPPORT)
PREFILLED_SYRINGE | INTRAVENOUS | Status: AC
  Filled 2018-07-11: qty 10

## 2018-07-11 MED ORDER — LIDOCAINE 2% (20 MG/ML) 5 ML SYRINGE
INTRAMUSCULAR | Status: DC | PRN
  Administered 2018-07-11: 60 mg via INTRAVENOUS

## 2018-07-11 MED ORDER — TRANEXAMIC ACID-NACL 1000-0.7 MG/100ML-% IV SOLN
1000.0000 mg | Freq: Once | INTRAVENOUS | Status: AC
  Administered 2018-07-11: 1000 mg via INTRAVENOUS
  Filled 2018-07-11: qty 100

## 2018-07-11 MED ORDER — MONTELUKAST SODIUM 10 MG PO TABS: 10.0000 mg | ORAL_TABLET | Freq: Every evening | ORAL | Status: DC | PRN

## 2018-07-11 MED ORDER — AMITRIPTYLINE HCL 25 MG PO TABS
25.0000 mg | ORAL_TABLET | Freq: Every day | ORAL | Status: DC
  Administered 2018-07-11: 25 mg via ORAL
  Filled 2018-07-11: qty 1

## 2018-07-11 MED ORDER — MIDAZOLAM HCL 2 MG/2ML IJ SOLN
INTRAMUSCULAR | Status: AC
  Filled 2018-07-11: qty 2

## 2018-07-11 MED ORDER — FENTANYL CITRATE (PF) 100 MCG/2ML IJ SOLN
INTRAMUSCULAR | Status: DC | PRN
  Administered 2018-07-11 (×2): 50 ug via INTRAVENOUS

## 2018-07-11 MED ORDER — TRAMADOL HCL 50 MG PO TABS
50.0000 mg | ORAL_TABLET | Freq: Four times a day (QID) | ORAL | Status: DC | PRN
  Administered 2018-07-12: 100 mg via ORAL
  Filled 2018-07-11 (×2): qty 2

## 2018-07-11 MED ORDER — PHENOL 1.4 % MT LIQD: 1.0000 | OROMUCOSAL | Status: DC | PRN

## 2018-07-11 MED ORDER — AZELASTINE HCL 0.1 % NA SOLN
1.0000 | Freq: Every day | NASAL | Status: DC | PRN
  Filled 2018-07-11: qty 30

## 2018-07-11 MED ORDER — MORPHINE SULFATE (PF) 2 MG/ML IV SOLN: 0.5000 mg | INTRAVENOUS | Status: DC | PRN

## 2018-07-11 MED ORDER — ONDANSETRON HCL 4 MG/2ML IJ SOLN: 4.0000 mg | Freq: Four times a day (QID) | INTRAMUSCULAR | Status: DC | PRN

## 2018-07-11 MED ORDER — FLUTICASONE PROPIONATE 50 MCG/ACT NA SUSP: 1.0000 | Freq: Every day | NASAL | Status: DC | PRN

## 2018-07-11 MED ORDER — ONDANSETRON HCL 4 MG PO TABS: 4.0000 mg | ORAL_TABLET | Freq: Four times a day (QID) | ORAL | Status: DC | PRN

## 2018-07-11 MED ORDER — ALBUMIN HUMAN 5 % IV SOLN
INTRAVENOUS | Status: AC
  Filled 2018-07-11: qty 250

## 2018-07-11 MED ORDER — ASPIRIN EC 325 MG PO TBEC
325.0000 mg | DELAYED_RELEASE_TABLET | Freq: Two times a day (BID) | ORAL | Status: DC
  Administered 2018-07-12: 325 mg via ORAL
  Filled 2018-07-11: qty 1

## 2018-07-11 MED ORDER — PROPOFOL 10 MG/ML IV BOLUS
INTRAVENOUS | Status: DC | PRN
  Administered 2018-07-11: 20 mg via INTRAVENOUS

## 2018-07-11 MED ORDER — MENTHOL 3 MG MT LOZG: 1.0000 | LOZENGE | OROMUCOSAL | Status: DC | PRN

## 2018-07-11 MED ORDER — MIDAZOLAM HCL 2 MG/2ML IJ SOLN
INTRAMUSCULAR | Status: DC | PRN
  Administered 2018-07-11: 2 mg via INTRAVENOUS

## 2018-07-11 MED ORDER — STERILE WATER FOR IRRIGATION IR SOLN
Status: DC | PRN
  Administered 2018-07-11: 2000 mL

## 2018-07-11 MED ORDER — PROPOFOL 10 MG/ML IV BOLUS
INTRAVENOUS | Status: AC
  Filled 2018-07-11: qty 40

## 2018-07-11 MED ORDER — LORATADINE 10 MG PO TABS: 10.0000 mg | ORAL_TABLET | Freq: Every day | ORAL | Status: DC | PRN

## 2018-07-11 MED ORDER — POLYETHYLENE GLYCOL 3350 17 G PO PACK: 17.0000 g | PACK | Freq: Every day | ORAL | Status: DC | PRN

## 2018-07-11 MED ORDER — BUPIVACAINE-EPINEPHRINE (PF) 0.25% -1:200000 IJ SOLN
INTRAMUSCULAR | Status: DC | PRN
  Administered 2018-07-11: 30 mL

## 2018-07-11 MED ORDER — FENTANYL CITRATE (PF) 100 MCG/2ML IJ SOLN
INTRAMUSCULAR | Status: AC
  Filled 2018-07-11: qty 2

## 2018-07-11 MED ORDER — ACETAMINOPHEN 500 MG PO TABS
500.0000 mg | ORAL_TABLET | Freq: Four times a day (QID) | ORAL | Status: DC
  Administered 2018-07-11 – 2018-07-12 (×3): 500 mg via ORAL
  Filled 2018-07-11 (×3): qty 1

## 2018-07-11 MED ORDER — METHOCARBAMOL 500 MG PO TABS
500.0000 mg | ORAL_TABLET | Freq: Four times a day (QID) | ORAL | Status: DC | PRN
  Administered 2018-07-12 (×2): 500 mg via ORAL
  Filled 2018-07-11 (×2): qty 1

## 2018-07-11 MED ORDER — DEXAMETHASONE SODIUM PHOSPHATE 10 MG/ML IJ SOLN
INTRAMUSCULAR | Status: DC | PRN
  Administered 2018-07-11: 10 mg via INTRAVENOUS

## 2018-07-11 MED ORDER — ONDANSETRON HCL 4 MG/2ML IJ SOLN
INTRAMUSCULAR | Status: AC
  Filled 2018-07-11: qty 2

## 2018-07-11 MED ORDER — LACTATED RINGERS IV SOLN
INTRAVENOUS | Status: DC
  Administered 2018-07-11 (×3): via INTRAVENOUS

## 2018-07-11 MED ORDER — POVIDONE-IODINE 10 % EX SWAB
2.0000 "application " | Freq: Once | CUTANEOUS | Status: AC
  Administered 2018-07-11: 2 via TOPICAL

## 2018-07-11 MED ORDER — HYDROMORPHONE HCL 1 MG/ML IJ SOLN: 0.2500 mg | INTRAMUSCULAR | Status: DC | PRN

## 2018-07-11 MED ORDER — METHOCARBAMOL 500 MG IVPB - SIMPLE MED
500.0000 mg | Freq: Four times a day (QID) | INTRAVENOUS | Status: DC | PRN
  Filled 2018-07-11: qty 50

## 2018-07-11 MED ORDER — METOCLOPRAMIDE HCL 5 MG PO TABS: 5.0000 mg | ORAL_TABLET | Freq: Three times a day (TID) | ORAL | Status: DC | PRN

## 2018-07-11 MED ORDER — BUPIVACAINE-EPINEPHRINE (PF) 0.25% -1:200000 IJ SOLN
INTRAMUSCULAR | Status: AC
  Filled 2018-07-11: qty 30

## 2018-07-11 MED ORDER — DIPHENHYDRAMINE HCL 12.5 MG/5ML PO ELIX: 12.5000 mg | ORAL_SOLUTION | ORAL | Status: DC | PRN

## 2018-07-11 MED ORDER — SODIUM CHLORIDE 0.9 % IV SOLN
INTRAVENOUS | Status: DC | PRN
  Administered 2018-07-11: 50 ug/min via INTRAVENOUS

## 2018-07-11 MED ORDER — ALBUMIN HUMAN 5 % IV SOLN
INTRAVENOUS | Status: DC | PRN
  Administered 2018-07-11 (×2): via INTRAVENOUS

## 2018-07-11 MED ORDER — TRANEXAMIC ACID-NACL 1000-0.7 MG/100ML-% IV SOLN
1000.0000 mg | INTRAVENOUS | Status: AC
  Administered 2018-07-11: 1000 mg via INTRAVENOUS
  Filled 2018-07-11: qty 100

## 2018-07-11 MED ORDER — ACETAMINOPHEN 10 MG/ML IV SOLN
INTRAVENOUS | Status: DC | PRN
  Administered 2018-07-11: 1000 mg via INTRAVENOUS

## 2018-07-11 MED ORDER — ACETAMINOPHEN 10 MG/ML IV SOLN
INTRAVENOUS | Status: AC
  Filled 2018-07-11: qty 100

## 2018-07-11 MED ORDER — AMLODIPINE BESYLATE 5 MG PO TABS
2.5000 mg | ORAL_TABLET | Freq: Every day | ORAL | Status: DC
  Administered 2018-07-12: 2.5 mg via ORAL
  Filled 2018-07-11: qty 1

## 2018-07-11 MED ORDER — CEFAZOLIN SODIUM-DEXTROSE 2-4 GM/100ML-% IV SOLN
2.0000 g | INTRAVENOUS | Status: AC
  Administered 2018-07-11: 2 g via INTRAVENOUS
  Filled 2018-07-11: qty 100

## 2018-07-11 SURGICAL SUPPLY — 47 items
BAG DECANTER FOR FLEXI CONT (MISCELLANEOUS) IMPLANT
BAG SPEC THK2 15X12 ZIP CLS (MISCELLANEOUS)
BAG ZIPLOCK 12X15 (MISCELLANEOUS) IMPLANT
BLADE SAG 18X100X1.27 (BLADE) ×3 IMPLANT
CLOSURE WOUND 1/2 X4 (GAUZE/BANDAGES/DRESSINGS) ×2
COVER PERINEAL POST (MISCELLANEOUS) ×3 IMPLANT
COVER SURGICAL LIGHT HANDLE (MISCELLANEOUS) ×3 IMPLANT
COVER WAND RF STERILE (DRAPES) IMPLANT
CUP ACETBLR 52 OD PINNACLE (Hips) ×2 IMPLANT
DECANTER SPIKE VIAL GLASS SM (MISCELLANEOUS) ×3 IMPLANT
DRAPE STERI IOBAN 125X83 (DRAPES) ×3 IMPLANT
DRAPE U-SHAPE 47X51 STRL (DRAPES) ×6 IMPLANT
DRSG ADAPTIC 3X8 NADH LF (GAUZE/BANDAGES/DRESSINGS) ×3 IMPLANT
DRSG MEPILEX BORDER 4X4 (GAUZE/BANDAGES/DRESSINGS) ×3 IMPLANT
DRSG MEPILEX BORDER 4X8 (GAUZE/BANDAGES/DRESSINGS) ×3 IMPLANT
DURAPREP 26ML APPLICATOR (WOUND CARE) ×3 IMPLANT
ELECT REM PT RETURN 15FT ADLT (MISCELLANEOUS) ×3 IMPLANT
EVACUATOR 1/8 PVC DRAIN (DRAIN) ×3 IMPLANT
FEM STEM 12/14 TAPER SZ 4 HIP (Orthopedic Implant) ×3 IMPLANT
FEMORAL STEM 12/14 TPR SZ4 HIP (Orthopedic Implant) IMPLANT
GLOVE BIO SURGEON STRL SZ 6 (GLOVE) IMPLANT
GLOVE BIO SURGEON STRL SZ7 (GLOVE) IMPLANT
GLOVE BIO SURGEON STRL SZ8 (GLOVE) ×3 IMPLANT
GLOVE BIOGEL PI IND STRL 6.5 (GLOVE) IMPLANT
GLOVE BIOGEL PI IND STRL 7.0 (GLOVE) IMPLANT
GLOVE BIOGEL PI IND STRL 8 (GLOVE) ×1 IMPLANT
GLOVE BIOGEL PI INDICATOR 6.5 (GLOVE)
GLOVE BIOGEL PI INDICATOR 7.0 (GLOVE)
GLOVE BIOGEL PI INDICATOR 8 (GLOVE) ×2
GOWN STRL REUS W/TWL LRG LVL3 (GOWN DISPOSABLE) ×3 IMPLANT
GOWN STRL REUS W/TWL XL LVL3 (GOWN DISPOSABLE) IMPLANT
HEAD FEMORAL 32 CERAMIC (Hips) ×2 IMPLANT
HOLDER FOLEY CATH W/STRAP (MISCELLANEOUS) ×3 IMPLANT
KIT TURNOVER KIT A (KITS) IMPLANT
LINER MARATHON NEUT +4X52X32 (Hips) ×2 IMPLANT
MANIFOLD NEPTUNE II (INSTRUMENTS) ×3 IMPLANT
PACK ANTERIOR HIP CUSTOM (KITS) ×3 IMPLANT
STRIP CLOSURE SKIN 1/2X4 (GAUZE/BANDAGES/DRESSINGS) ×3 IMPLANT
SUT ETHIBOND NAB CT1 #1 30IN (SUTURE) ×3 IMPLANT
SUT MNCRL AB 4-0 PS2 18 (SUTURE) ×3 IMPLANT
SUT STRATAFIX 0 PDS 27 VIOLET (SUTURE) ×3
SUT VIC AB 2-0 CT1 27 (SUTURE) ×6
SUT VIC AB 2-0 CT1 TAPERPNT 27 (SUTURE) ×2 IMPLANT
SUTURE STRATFX 0 PDS 27 VIOLET (SUTURE) ×1 IMPLANT
SYR 50ML LL SCALE MARK (SYRINGE) IMPLANT
TRAY FOLEY MTR SLVR 16FR STAT (SET/KITS/TRAYS/PACK) ×3 IMPLANT
YANKAUER SUCT BULB TIP 10FT TU (MISCELLANEOUS) ×3 IMPLANT

## 2018-07-11 NOTE — Interval H&P Note (Signed)
History and Physical Interval Note:  07/11/2018 12:41 PM  [name]  has presented today for surgery, with the diagnosis of Right hip osteoarthritis.  The various methods of treatment have been discussed with the patient and family. After consideration of risks, benefits and other options for treatment, the patient has consented to  Procedure(s) with comments: TOTAL HIP ARTHROPLASTY ANTERIOR APPROACH (Right) - 100 mins/ to follow 1st case as a surgical intervention.  The patient's history has been reviewed, patient examined, no change in status, stable for surgery.  I have reviewed the patient's chart and labs.  Questions were answered to the patient's satisfaction.     [name]

## 2018-07-11 NOTE — Evaluation (Signed)
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 Pt doing well today, amb  58' with min assist. Pt is hopeful to d/c tomorrow. Will continue to follow.  Pt will benefit from skilled PT to increase their independence and safety with mobility to allow discharge to the venue listed below.         Follow Up Recommendations Follow surgeon's recommendation for DC plan and follow-up therapies    Equipment Recommendations  None recommended by PT    Recommendations for Other Services       Precautions / Restrictions Precautions Precautions: Fall Restrictions Weight Bearing Restrictions: No Other Position/Activity Restrictions: WBAT      Mobility  Bed Mobility Overal bed mobility: Needs Assistance Bed Mobility: Supine to Sit     Supine to sit: Min assist     General bed mobility comments: assist wtih RLE, cues for technique  Transfers Overall transfer level: Needs assistance Equipment used: Rolling walker (2 wheeled) Transfers: Sit to/from Stand Sit to Stand: Min assist         General transfer comment: cues for hand placement and overall safety  Ambulation/Gait Ambulation/Gait assistance: Min guard;Min assist Gait Distance (Feet): 80 Feet Assistive device: Rolling walker (2 wheeled) Gait Pattern/deviations: Step-to pattern;Decreased stance time - right     General Gait Details: cues for sequence and RW position  Stairs            Wheelchair Mobility    Modified Rankin (Stroke Patients Only)       Balance                                             Pertinent Vitals/Pain Pain Assessment: 0-10 Pain Score: 2  Pain Location: R hip Pain Descriptors / Indicators: Discomfort Pain  Intervention(s): Limited activity within patient's tolerance;Monitored during session    Home Living Family/patient expects to be discharged to:: Private residence Living Arrangements: Spouse/significant other;Children Available Help at Discharge: Family Type of Home: House Home Access: Stairs to enter   Technical brewer of Steps: 2 Home Layout: Two level Home Equipment: Environmental consultant - 2 wheels;Cane - single point      Prior Function Level of Independence: Independent               Hand Dominance        Extremity/Trunk Assessment   Upper Extremity Assessment Upper Extremity Assessment: Overall WFL for tasks assessed    Lower Extremity Assessment Lower Extremity Assessment: RLE deficits/detail RLE Deficits / Details: ankle WFL, knee and hip grossly  2+ to 3/5       Communication   Communication: No difficulties  Cognition Arousal/Alertness: Awake/alert Behavior During Therapy: WFL for tasks assessed/performed Overall Cognitive Status: Within Functional Limits for tasks assessed                                        General Comments      Exercises Total Joint Exercises Ankle Circles/Pumps: AROM;10 reps;Both   Assessment/Plan    PT Assessment Patient needs continued PT  services  PT Problem List Decreased strength;Decreased activity tolerance;Decreased balance;Decreased mobility;Pain;Decreased knowledge of use of DME       PT Treatment Interventions DME instruction;Gait training;Therapeutic activities;Functional mobility training;Therapeutic exercise;Patient/family education;Stair training    PT Goals (Current goals can be found in the Care Plan section)  Acute Rehab PT Goals PT Goal Formulation: With patient Time For Goal Achievement: 07/18/18 Potential to Achieve Goals: Good    Frequency 7X/week   Barriers to discharge        Co-evaluation               AM-PAC PT "6 Clicks" Mobility  Outcome Measure Help needed  turning from your back to your side while in a flat bed without using bedrails?: A Little Help needed moving from lying on your back to sitting on the side of a flat bed without using bedrails?: A Little Help needed moving to and from a bed to a chair (including a wheelchair)?: A Little Help needed standing up from a chair using your arms (e.g., wheelchair or bedside chair)?: A Little Help needed to walk in hospital room?: A Little Help needed climbing 3-5 steps with a railing? : A Lot 6 Click Score: 17    End of Session Equipment Utilized During Treatment: Gait belt Activity Tolerance: Patient tolerated treatment well Patient left: in chair;with call bell/phone within reach Nurse Communication: Mobility status PT Visit Diagnosis: Difficulty in walking, not elsewhere classified (R26.2)    Time: 4276-7011 PT Time Calculation (min) (ACUTE ONLY): 23 min   Charges:   PT Evaluation $PT Eval Low Complexity: 1 Low PT Treatments $Gait Training: 8-22 mins        Kenyon Ana, PT  Pager: [phone] Acute Rehab Dept [facility]): [phone]   07/11/2018   [facility] 07/11/2018, 6:51 PM

## 2018-07-11 NOTE — Op Note (Signed)
OPERATIVE REPORT- TOTAL HIP ARTHROPLASTY   PREOPERATIVE DIAGNOSIS: Osteoarthritis of the Right hip.   POSTOPERATIVE DIAGNOSIS: Osteoarthritis of the Right  hip.   PROCEDURE: Right total hip arthroplasty, anterior approach.   SURGEON: Gaynelle Arabian, MD   ASSISTANT: Theresa Duty, PA-C  ANESTHESIA:  Spinal  ESTIMATED BLOOD LOSS:-600 mL    DRAINS: Hemovac x1.   COMPLICATIONS: None   CONDITION: PACU - hemodynamically stable.   BRIEF CLINICAL NOTE: [name] is a 73 y.o. male who has advanced end-  stage arthritis of their Right  hip with progressively worsening pain and  dysfunction.The patient has failed nonoperative management and presents for  total hip arthroplasty.   PROCEDURE IN DETAIL: After successful administration of spinal  anesthetic, the traction boots for the [facility] bed were placed on both  feet and the patient was placed onto the [facility] bed, boots placed into the leg  holders. The Right hip was then isolated from the perineum with plastic  drapes and prepped and draped in the usual sterile fashion. ASIS and  greater trochanter were marked and a oblique incision was made, starting  at about 1 cm lateral and 2 cm distal to the ASIS and coursing towards  the anterior cortex of the femur. The skin was cut with a 10 blade  through subcutaneous tissue to the level of the fascia overlying the  tensor fascia lata muscle. The fascia was then incised in line with the  incision at the junction of the anterior third and posterior 2/3rd. The  muscle was teased off the fascia and then the interval between the TFL  and the rectus was developed. The Hohmann retractor was then placed at  the top of the femoral neck over the capsule. The vessels overlying the  capsule were cauterized and the fat on top of the capsule was removed.  A Hohmann retractor was then placed anterior underneath the rectus  femoris to give exposure to the entire anterior capsule. A T-shaped   capsulotomy was performed. The edges were tagged and the femoral head  was identified.       Osteophytes are removed off the superior acetabulum.  The femoral neck was then cut in situ with an oscillating saw. Traction  was then applied to the left lower extremity utilizing the [facility]  traction. The femoral head was then removed. Retractors were placed  around the acetabulum and then circumferential removal of the labrum was  performed. Osteophytes were also removed. Reaming starts at 47 mm to  medialize and  Increased in 2 mm increments to 51 mm. We reamed in  approximately 40 degrees of abduction, 20 degrees anteversion. A 52 mm  pinnacle acetabular shell was then impacted in anatomic position under  fluoroscopic guidance with excellent purchase. We did not need to place  any additional dome screws. A 32 mm neutral + 4 marathon liner was then  placed into the acetabular shell.       The femoral lift was then placed along the lateral aspect of the femur  just distal to the vastus ridge. The leg was  externally rotated and capsule  was stripped off the inferior aspect of the femoral neck down to the  level of the lesser trochanter, this was done with electrocautery. The femur was lifted after this was performed. The  leg was then placed in an extended and adducted position essentially delivering the femur. We also removed the capsule superiorly and the piriformis from the piriformis  fossa to gain excellent exposure of the  proximal femur. Rongeur was used to remove some cancellous bone to get  into the lateral portion of the proximal femur for placement of the  initial starter reamer. The starter broaches was placed  the starter broach  and was shown to go down the center of the canal. Broaching  with the Actis system was then performed starting at size 0  coursing  Up to size 4. A size 4 had excellent torsional and rotational  and axial stability. The trial high offset neck was then placed   with a 32 + 1 trial head. The hip was then reduced. We confirmed that  the stem was in the canal both on AP and lateral x-rays. It also has excellent sizing. The hip was reduced with outstanding stability through full extension and full external rotation.. AP pelvis was taken and the leg lengths were measured and found to be equal. Hip was then dislocated again and the femoral head and neck removed. The  femoral broach was removed. Size 4 Actis stem with a high offset  neck was then impacted into the femur following native anteversion. Has  excellent purchase in the canal. Excellent torsional and rotational and  axial stability. It is confirmed to be in the canal on AP and lateral  fluoroscopic views. The 32 + 1 ceramic head was placed and the hip  reduced with outstanding stability. Again AP pelvis was taken and it  confirmed that the leg lengths were equal. The wound was then copiously  irrigated with saline solution and the capsule reattached and repaired  with Ethibond suture. 30 ml of .25% Bupivicaine was  injected into the capsule and into the edge of the tensor fascia lata as well as subcutaneous tissue. The fascia overlying the tensor fascia lata was then closed with a running #1 V-Loc. Subcu was closed with interrupted 2-0 Vicryl and subcuticular running 4-0 Monocryl. Incision was cleaned  and dried. Steri-Strips and a bulky sterile dressing applied. Hemovac  drain was hooked to suction and then the patient was awakened and transported to  recovery in stable condition.        Please note that a surgical assistant was a medical necessity for this procedure to perform it in a safe and expeditious manner. Assistant was necessary to provide appropriate retraction of vital neurovascular structures and to prevent femoral fracture and allow for anatomic placement of the prosthesis.  Gaynelle Arabian, M.D.

## 2018-07-11 NOTE — Anesthesia Procedure Notes (Signed)
Procedure Name: MAC Date/Time: 07/11/2018 1:52 PM Performed by: Eben Burow, CRNA Pre-anesthesia Checklist: Patient identified, Emergency Drugs available, Suction available, Patient being monitored and Timeout performed Oxygen Delivery Method: Simple face mask Dental Injury: Teeth and Oropharynx as per pre-operative assessment

## 2018-07-11 NOTE — Anesthesia Procedure Notes (Signed)
Spinal  Patient location during procedure: OR Start time: 07/11/2018 1:53 PM End time: 07/11/2018 1:57 PM Staffing Anesthesiologist: Roderic Palau, MD Performed: anesthesiologist  Preanesthetic Checklist Completed: patient identified, surgical consent, pre-op evaluation, timeout performed, IV checked, risks and benefits discussed and monitors and equipment checked Spinal Block Patient position: sitting Prep: DuraPrep Patient monitoring: cardiac monitor, continuous pulse ox and blood pressure Approach: midline Location: L3-4 Injection technique: single-shot Needle Needle type: Pencan  Needle gauge: 24 G Needle length: 9 cm Assessment Sensory level: T8 Additional Notes Functioning IV was confirmed and monitors were applied. Sterile prep and drape, including hand hygiene and sterile gloves were used. The patient was positioned and the spine was prepped. The skin was anesthetized with lidocaine.  Free flow of clear CSF was obtained prior to injecting local anesthetic into the CSF.  The spinal needle aspirated freely following injection.  The needle was carefully withdrawn.  The patient tolerated the procedure well.

## 2018-07-11 NOTE — Anesthesia Postprocedure Evaluation (Signed)
Anesthesia Post Note  Patient: JAG LENZ  Procedure(s) Performed: TOTAL HIP ARTHROPLASTY ANTERIOR APPROACH (Right Hip)     Patient location during evaluation: PACU Anesthesia Type: Spinal Level of consciousness: oriented and awake and alert Pain management: pain level controlled Vital Signs Assessment: post-procedure vital signs reviewed and stable Respiratory status: spontaneous breathing, respiratory function stable and patient connected to nasal cannula oxygen Cardiovascular status: blood pressure returned to baseline and stable Postop Assessment: no headache, no backache, no apparent nausea or vomiting, spinal receding and patient able to bend at knees Anesthetic complications: no    Last Vitals:  Vitals:   07/11/18 1645 07/11/18 1658  BP: 115/72 121/63  Pulse: 69 73  Resp: 16 14  Temp: 36.6 C 36.6 C  SpO2: 99% 100%    Last Pain:  Vitals:   07/11/18 1658  TempSrc: Oral  PainSc: 0-No pain                 Nicolas Banh,W. EDMOND

## 2018-07-11 NOTE — Discharge Instructions (Signed)
°Dr. Frank [name] °Total Joint Specialist °[facility] °[address] °[place], [place] [address] °[phone] ° °ANTERIOR APPROACH TOTAL HIP REPLACEMENT POSTOPERATIVE DIRECTIONS ° ° °Hip Rehabilitation, Guidelines Following Surgery  °The results of a hip operation are greatly improved after range of motion and muscle strengthening exercises. Follow all safety measures which are given to protect your hip. If any of these exercises cause increased pain or swelling in your joint, decrease the amount until you are comfortable again. Then slowly increase the exercises. Call your caregiver if you have problems or questions.  ° °HOME CARE INSTRUCTIONS  °• Remove items at home which could result in a fall. This includes throw rugs or furniture in walking pathways.  °· ICE to the affected hip every three hours for 30 minutes at a time and then as needed for pain and swelling.  Continue to use ice on the hip for pain and swelling from surgery. You may notice swelling that will progress down to the foot and ankle.  This is normal after surgery.  Elevate the leg when you are not up walking on it.   °· Continue to use the breathing machine which will help keep your temperature down.  It is common for your temperature to cycle up and down following surgery, especially at night when you are not up moving around and exerting yourself.  The breathing machine keeps your lungs expanded and your temperature down. ° °DIET °You may resume your previous home diet once your are discharged from the hospital. ° °DRESSING / WOUND CARE / SHOWERING °You may change your dressing 3-5 days after surgery.  Then change the dressing every day with sterile gauze.  Please use good hand washing techniques before changing the dressing.  Do not use any lotions or creams on the incision until instructed by your surgeon. °You may start showering once you are discharged home but do not submerge the incision under water. Just pat the  incision dry and apply a dry gauze dressing on daily. °Change the surgical dressing daily and reapply a dry dressing each time. ° °ACTIVITY °Walk with your walker as instructed. °Use walker as long as suggested by your caregivers. °Avoid periods of inactivity such as sitting longer than an hour when not asleep. This helps prevent blood clots.  °You may resume a sexual relationship in one month or when given the OK by your doctor.  °You may return to work once you are cleared by your doctor.  °Do not drive a car for 6 weeks or until released by you surgeon.  °Do not drive while taking narcotics. ° °WEIGHT BEARING °Weight bearing as tolerated with assist device (walker, cane, etc) as directed, use it as long as suggested by your surgeon or therapist, typically at least 4-6 weeks. ° °POSTOPERATIVE CONSTIPATION PROTOCOL °Constipation - defined medically as fewer than three stools per week and severe constipation as less than one stool per week. ° °One of the most common issues patients have following surgery is constipation.  Even if you have a regular bowel pattern at home, your normal regimen is likely to be disrupted due to multiple reasons following surgery.  Combination of anesthesia, postoperative narcotics, change in appetite and fluid intake all can affect your bowels.  In order to avoid complications following surgery, here are some recommendations in order to help you during your recovery period. ° °Colace (docusate) - Pick up an over-the-counter form of Colace or another stool softener and take twice a day   as long as you are requiring postoperative pain medications.  Take with a full glass of water daily.  If you experience loose stools or diarrhea, hold the colace until you stool forms back up.  If your symptoms do not get better within 1 week or if they get worse, check with your doctor. ° °Dulcolax (bisacodyl) - Pick up over-the-counter and take as directed by the product packaging as needed to assist with  the movement of your bowels.  Take with a full glass of water.  Use this product as needed if not relieved by Colace only.  ° °MiraLax (polyethylene glycol) - Pick up over-the-counter to have on hand.  MiraLax is a solution that will increase the amount of water in your bowels to assist with bowel movements.  Take as directed and can mix with a glass of water, juice, soda, coffee, or tea.  Take if you go more than two days without a movement. °Do not use MiraLax more than once per day. Call your doctor if you are still constipated or irregular after using this medication for 7 days in a row. ° °If you continue to have problems with postoperative constipation, please contact the office for further assistance and recommendations.  If you experience "the worst abdominal pain ever" or develop nausea or vomiting, please contact the office immediatly for further recommendations for treatment. ° °ITCHING ° If you experience itching with your medications, try taking only a single pain pill, or even half a pain pill at a time.  You can also use Benadryl over the counter for itching or also to help with sleep.  ° °TED HOSE STOCKINGS °Wear the elastic stockings on both legs for three weeks following surgery during the day but you may remove then at night for sleeping. ° °MEDICATIONS °See your medication summary on the “After Visit Summary” that the nursing staff will review with you prior to discharge.  You may have some home medications which will be placed on hold until you complete the course of blood thinner medication.  It is important for you to complete the blood thinner medication as prescribed by your surgeon.  Continue your approved medications as instructed at time of discharge. ° °PRECAUTIONS °If you experience chest pain or shortness of breath - call 911 immediately for transfer to the hospital emergency department.  °If you develop a fever greater that 101 F, purulent drainage from wound, increased redness or  drainage from wound, foul odor from the wound/dressing, or calf pain - CONTACT YOUR SURGEON.   °                                                °FOLLOW-UP APPOINTMENTS °Make sure you keep all of your appointments after your operation with your surgeon and caregivers. You should call the office at the above phone number and make an appointment for approximately two weeks after the date of your surgery or on the date instructed by your surgeon outlined in the "After Visit Summary". ° °RANGE OF MOTION AND STRENGTHENING EXERCISES  °These exercises are designed to help you keep full movement of your hip joint. Follow your caregiver's or physical therapist's instructions. Perform all exercises about fifteen times, three times per day or as directed. Exercise both hips, even if you have had only one joint replacement. These exercises can be done on   a training (exercise) mat, on the floor, on a table or on a bed. Use whatever works the best and is most comfortable for you. Use music or television while you are exercising so that the exercises are a pleasant break in your day. This will make your life better with the exercises acting as a break in routine you can look forward to.   Lying on your back, slowly slide your foot toward your buttocks, raising your knee up off the floor. Then slowly slide your foot back down until your leg is straight again.   Lying on your back spread your legs as far apart as you can without causing discomfort.   Lying on your side, raise your upper leg and foot straight up from the floor as far as is comfortable. Slowly lower the leg and repeat.   Lying on your back, tighten up the muscle in the front of your thigh (quadriceps muscles). You can do this by keeping your leg straight and trying to raise your heel off the floor. This helps strengthen the largest muscle supporting your knee.   Lying on your back, tighten up the muscles of your buttocks both with the legs straight and with  the knee bent at a comfortable angle while keeping your heel on the floor.   IF YOU ARE TRANSFERRED TO A SKILLED REHAB FACILITY If the patient is transferred to a skilled rehab facility following release from the hospital, a list of the current medications will be sent to the facility for the patient to continue.  When discharged from the skilled rehab facility, please have the facility set up the patient's [place] prior to being released. Also, the skilled facility will be responsible for providing the patient with their medications at time of release from the facility to include their pain medication, the muscle relaxants, and their blood thinner medication. If the patient is still at the rehab facility at time of the two week follow up appointment, the skilled rehab facility will also need to assist the patient in arranging follow up appointment in our office and any transportation needs.  MAKE SURE YOU:   Understand these instructions.   Get help right away if you are not doing well or get worse.    Pick up stool softner and laxative for home use following surgery while on pain medications. Do not submerge incision under water. Please use good hand washing techniques while changing dressing each day. May shower starting three days after surgery. Please use a clean towel to pat the incision dry following showers. Continue to use ice for pain and swelling after surgery. Do not use any lotions or creams on the incision until instructed by your surgeon.

## 2018-07-11 NOTE — Transfer of Care (Signed)
Immediate Anesthesia Transfer of Care Note  Patient: [name]  Procedure(s) Performed: TOTAL HIP ARTHROPLASTY ANTERIOR APPROACH (Right Hip)  Patient Location: PACU  Anesthesia Type:MAC and Spinal  Level of Consciousness: awake, alert , oriented and patient cooperative  Airway & Oxygen Therapy: Patient Spontanous Breathing and Patient connected to face mask oxygen  Post-op Assessment: Report given to RN and Post -op Vital signs reviewed and stable  Post vital signs: Reviewed and stable  Last Vitals:  Vitals Value Taken Time  BP 113/66 07/11/18 1540  Temp    Pulse 82 07/11/18 1542  Resp 13 07/11/18 1542  SpO2 100 % 07/11/18 1542  Vitals shown include unvalidated device data.  Last Pain:  Vitals:   07/11/18 1540  TempSrc:   PainSc: (P) 0-No pain         Complications: No apparent anesthesia complications

## 2018-07-11 NOTE — Anesthesia Preprocedure Evaluation (Addendum)
Anesthesia Evaluation  Patient identified by MRN, date of birth, ID band Patient awake    Reviewed: Allergy & Precautions, H&P , NPO status , Patient's Chart, lab work & pertinent test results  Airway Mallampati: II  TM Distance: >3 FB Neck ROM: Full    Dental no notable dental hx. (+) Teeth Intact, Dental Advisory Given   Pulmonary neg pulmonary ROS, former smoker,    Pulmonary exam normal breath sounds clear to auscultation       Cardiovascular hypertension, Pt. on medications  Rhythm:Regular Rate:Normal     Neuro/Psych negative neurological ROS  negative psych ROS   GI/Hepatic Neg liver ROS, GERD  Medicated and Controlled,  Endo/Other  negative endocrine ROS  Renal/GU negative Renal ROS  negative genitourinary   Musculoskeletal   Abdominal   Peds  Hematology negative hematology ROS (+)   Anesthesia Other Findings   Reproductive/Obstetrics negative OB ROS                            Anesthesia Physical Anesthesia Plan  ASA: II  Anesthesia Plan: Spinal   Post-op Pain Management:    Induction: Intravenous  PONV Risk Score and Plan: 2 and Ondansetron, Dexamethasone and Propofol infusion  Airway Management Planned: Simple Face Mask  Additional Equipment:   Intra-op Plan:   Post-operative Plan:   Informed Consent: I have reviewed the patients History and Physical, chart, labs and discussed the procedure including the risks, benefits and alternatives for the proposed anesthesia with the patient or authorized representative who has indicated his/her understanding and acceptance.     Dental advisory given  Plan Discussed with: CRNA  Anesthesia Plan Comments:         Anesthesia Quick Evaluation

## 2018-07-12 ENCOUNTER — Encounter (HOSPITAL_COMMUNITY): Payer: Self-pay | Admitting: Orthopedic Surgery

## 2018-07-12 DIAGNOSIS — M1611 Unilateral primary osteoarthritis, right hip: Secondary | ICD-10-CM | POA: Diagnosis not present

## 2018-07-12 LAB — CBC
HCT: 33.1 % — ABNORMAL LOW (ref 39.0–52.0)
Hemoglobin: 10.9 g/dL — ABNORMAL LOW (ref 13.0–17.0)
MCH: 32.2 pg (ref 26.0–34.0)
MCHC: 32.9 g/dL (ref 30.0–36.0)
MCV: 97.6 fL (ref 80.0–100.0)
Platelets: 262 10*3/uL (ref 150–400)
RBC: 3.39 MIL/uL — ABNORMAL LOW (ref 4.22–5.81)
RDW: 11.9 % (ref 11.5–15.5)
WBC: 7.5 10*3/uL (ref 4.0–10.5)
nRBC: 0 % (ref 0.0–0.2)

## 2018-07-12 LAB — BASIC METABOLIC PANEL
Anion gap: 11 (ref 5–15)
BUN: 13 mg/dL (ref 8–23)
CO2: 23 mmol/L (ref 22–32)
Calcium: 8 mg/dL — ABNORMAL LOW (ref 8.9–10.3)
Chloride: 101 mmol/L (ref 98–111)
Creatinine, Ser: 1.05 mg/dL (ref 0.61–1.24)
GFR calc Af Amer: >60 mL/min (ref >60)
GFR calc non Af Amer: >60 mL/min (ref >60)
Glucose, Bld: 180 mg/dL — ABNORMAL HIGH (ref 70–99)
Potassium: 4.1 mmol/L (ref 3.5–5.1)
Sodium: 135 mmol/L (ref 135–145)

## 2018-07-12 MED ORDER — TRAMADOL HCL 50 MG PO TABS: 50.0000 mg | ORAL_TABLET | Freq: Four times a day (QID) | ORAL | 0 refills | Status: AC | PRN

## 2018-07-12 MED ORDER — HYDROCODONE-ACETAMINOPHEN 5-325 MG PO TABS: 1.0000 | ORAL_TABLET | Freq: Four times a day (QID) | ORAL | 0 refills | Status: AC | PRN

## 2018-07-12 MED ORDER — ASPIRIN 325 MG PO TBEC: 325.0000 mg | DELAYED_RELEASE_TABLET | Freq: Two times a day (BID) | ORAL | 0 refills | Status: AC

## 2018-07-12 MED ORDER — METHOCARBAMOL 500 MG PO TABS: 500.0000 mg | ORAL_TABLET | Freq: Four times a day (QID) | ORAL | 0 refills | Status: DC | PRN

## 2018-07-12 NOTE — Progress Notes (Signed)
Physical Therapy Treatment Patient Details Name: [name] MRN: [id number] DOB: Mar 10, 1945 Today's Date: 07/12/2018    History of Present Illness s/p R DA THA. 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 Ready for d/c from PT standpoint.   Follow Up Recommendations  Follow surgeon's recommendation for DC plan and follow-up therapies     Equipment Recommendations  None recommended by PT    Recommendations for Other Services       Precautions / Restrictions Precautions Precautions: Fall Restrictions Weight Bearing Restrictions: No RLE Weight Bearing: Weight bearing as tolerated Other Position/Activity Restrictions: WBAT    Mobility  Bed Mobility Overal bed mobility: Needs Assistance Bed Mobility: Supine to Sit     Supine to sit: Supervision     General bed mobility comments: instructed in use of gait belt as leg lifter  Transfers Overall transfer level: Needs assistance Equipment used: Rolling walker (2 wheeled) Transfers: Sit to/from Stand Sit to Stand: Supervision         General transfer comment: cues for hand placement and overall safety  Ambulation/Gait Ambulation/Gait assistance: Supervision;Min guard Gait Distance (Feet): 140 Feet Assistive device: Rolling walker (2 wheeled) Gait Pattern/deviations: Step-to pattern;Decreased stance time - right;Step-through pattern     General Gait Details: cues for sequence, RW position and gait progression   Stairs Stairs: Yes Stairs assistance: Min guard Stair Management: One rail Right;One rail Left;Sideways Number of Stairs: 3 General stair comments: cues for sequence, technique  and safety    Wheelchair Mobility    Modified Rankin (Stroke Patients Only)       Balance                                            Cognition Arousal/Alertness: Awake/alert Behavior During Therapy: WFL for tasks assessed/performed Overall Cognitive Status:  Within Functional Limits for tasks assessed                                        Exercises Total Joint Exercises Ankle Circles/Pumps: AROM;10 reps;Both Quad Sets: AROM;10 reps;Both Heel Slides: AROM;Right;10 reps Hip ABduction/ADduction: AROM;10 reps;Right Long Arc Quad: AROM;Right;10 reps    General Comments        Pertinent Vitals/Pain Pain Assessment: 0-10 Pain Score: 2  Pain Location: R hip Pain Descriptors / Indicators: Discomfort Pain Intervention(s): Limited activity within patient's tolerance;Monitored during session;Premedicated before session;Repositioned;Ice applied    Home Living                      Prior Function            PT Goals (current goals can now be found in the care plan section) Acute Rehab PT Goals PT Goal Formulation: With patient Time For Goal Achievement: 07/18/18 Potential to Achieve Goals: Good Progress towards PT goals: Progressing toward goals    Frequency    7X/week      PT Plan Current plan remains appropriate    Co-evaluation              AM-PAC PT "6 Clicks" Mobility   Outcome Measure  Help needed turning from your back to your side while in a flat bed without using bedrails?: A Little Help needed moving  from lying on your back to sitting on the side of a flat bed without using bedrails?: A Little Help needed moving to and from a bed to a chair (including a wheelchair)?: A Little Help needed standing up from a chair using your arms (e.g., wheelchair or bedside chair)?: A Little Help needed to walk in hospital room?: A Little Help needed climbing 3-5 steps with a railing? : A Little 6 Click Score: 18    End of Session Equipment Utilized During Treatment: Gait belt Activity Tolerance: Patient tolerated treatment well Patient left: in chair;with call bell/phone within reach Nurse Communication: Mobility status PT Visit Diagnosis: Difficulty in walking, not elsewhere classified (R26.2)      Time: 2620-3559 PT Time Calculation (min) (ACUTE ONLY): 34 min  Charges:  $Gait Training: 23-37 mins                     Kenyon Ana, PT  Pager: [phone] Acute Rehab Dept [facility]): [phone]   07/12/2018    [facility] 07/12/2018, 12:27 PM

## 2018-07-12 NOTE — Progress Notes (Signed)
   Subjective: 1 Day Post-Op Procedure(s) (LRB): TOTAL HIP ARTHROPLASTY ANTERIOR APPROACH (Right) Patient reports pain as mild.   Patient seen in rounds with Dr. Wynelle Link. Patient is well, and has had no acute complaints or problems other than some mild discomfort in the right hip. No issues overnight. Denies chest pain or SOB. Foley catheter removed this AM. Did well ambulating with physical therapy yesterday, will continue working with them today.   Objective: Vital signs in last 24 hours: Temp:  [97.5 F (36.4 C)-98.5 F (36.9 C)] 97.5 F (36.4 C) (06/18 0508) Pulse Rate:  [64-96] 70 (06/18 0508) Resp:  [14-19] 16 (06/18 0508) BP: (98-151)/(52-81) 141/75 (06/18 0508) SpO2:  [96 %-100 %] 100 % (06/18 0508) Weight:  [86 kg] 86 kg (06/17 1221)  Intake/Output from previous day:  Intake/Output Summary (Last 24 hours) at 07/12/2018 0758 Last data filed at 07/12/2018 0600 Gross per 24 hour  Intake 4898.67 ml  Output 3945 ml  Net 953.67 ml    Labs: Recent Labs    07/12/18 0311  HGB 10.9*   Recent Labs    07/12/18 0311  WBC 7.5  RBC 3.39*  HCT 33.1*  PLT 262   Recent Labs    07/12/18 0311  NA 135  K 4.1  CL 101  CO2 23  BUN 13  CREATININE 1.05  GLUCOSE 180*  CALCIUM 8.0*   Exam: General - Patient is Alert and Oriented Extremity - Neurologically intact Neurovascular intact Sensation intact distally Dorsiflexion/Plantar flexion intact Dressing - dressing C/D/I Motor Function - intact, moving foot and toes well on exam.   Past Medical History:  Diagnosis Date  . Cancer [facility])    prostate  . Erectile dysfunction   . GERD without esophagitis   . History of kidney stones   . Hyperlipidemia   . Hypertension   . Irritable bowel   . PAD (peripheral artery disease) ([place])   . Pneumonia   . Rhinitis     Assessment/Plan: 1 Day Post-Op Procedure(s) (LRB): TOTAL HIP ARTHROPLASTY ANTERIOR APPROACH (Right) Principal Problem:   OA (osteoarthritis) of hip   Estimated body mass index is 29.7 kg/m as calculated from the following:   Height as of this encounter: 5\' 7"  (1.702 m).   Weight as of this encounter: 86 kg. Advance diet Up with therapy D/C IV fluids  DVT Prophylaxis - Aspirin Weight bearing as tolerated. D/C O2 and pulse ox and try on room air. Hemovac pulled without difficulty, will continue therapy.  Plan is to go Home after hospital stay. Plan for discharge later today once progresses with therapy and meeting his goals with HEP. Follow-up in the office in 2 weeks.   Theresa Duty, PA-C Orthopedic Surgery 07/12/2018, 7:58 AM

## 2018-07-12 NOTE — TOC Transition Note (Addendum)
Transition of Care [facility]) - CM/SW Discharge Note   Patient Details  Name: [name] MRN: [id number] Date of Birth: Sep 27, 1945  [facility] of [facility] CM/SW Contact:  Leeroy Cha, RN Phone Number: 07/12/2018, 9:58 AM   Clinical Narrative:    dcd to home hep ane equip   Final next level of care: Home/Self Care Barriers to Discharge: No Barriers Identified   Patient Goals and CMS Choice Patient states their goals for this hospitalization and ongoing recovery are:: to go home and do these excercises right CMS Medicare.gov Compare Post Acute Care list provided to:: Patient    Discharge Placement                       Discharge Plan and Services   Discharge Planning Services: CM Consult Post Acute Care Choice: Durable Medical Equipment          DME Arranged: 3-N-1 DME Agency: [employer] Date DME Agency Contacted: 07/12/18 Time DME Agency Contacted: 0900 Representative spoke with at DME Agency: nathan            Social Determinants of Health ([place]) Interventions     Readmission Risk Interventions No flowsheet data found.

## 2018-07-12 NOTE — Plan of Care (Signed)
Patient meets requirements for discharge.

## 2018-07-12 NOTE — Care Management CC44 (Signed)
Condition Code 44 Documentation Completed  Patient Details  Name: [name] MRN: [id number] Date of Birth: March 08, 1945   Condition Code 44 given:  Yes Patient signature on Condition Code 44 notice:  Yes Documentation of 2 MD's agreement:  Yes Code 44 added to claim:       Lynnell Catalan, RN 07/12/2018, 12:57 PM

## 2018-07-12 NOTE — Care Management Obs Status (Signed)
[facility] NOTIFICATION   Patient Details  Name: RODRIGO MCGRANAHAN MRN: [id number] Date of Birth: [birth year]   Medicare Observation Status Notification Given:  Yes    Lynnell Catalan, RN 07/12/2018, 12:57 PM

## 2018-07-16 NOTE — Discharge Summary (Signed)
Physician Discharge Summary   Patient ID: [name] MRN: [id number] DOB/AGE: [birth year] 72 y.o.  Admit date: 07/11/2018 Discharge date: 07/12/2018  Primary Diagnosis: Osteoarthritis, right hip   Admission Diagnoses:  Past Medical History:  Diagnosis Date   Cancer [facility])    prostate   Erectile dysfunction    GERD without esophagitis    History of kidney stones    Hyperlipidemia    Hypertension    Irritable bowel    PAD (peripheral artery disease) (HCC)    Pneumonia    Rhinitis    Discharge Diagnoses:   Principal Problem:   OA (osteoarthritis) of hip  Estimated body mass index is 29.7 kg/m as calculated from the following:   Height as of this encounter: 5\' 7"  (1.702 m).   Weight as of this encounter: 86 kg.  Procedure:  Procedure(s) (LRB): TOTAL HIP ARTHROPLASTY ANTERIOR APPROACH (Right)   Consults: None  HPI: [name] is a 73 y.o. male who has advanced end-stage arthritis of their Right  hip with progressively worsening pain and dysfunction.The patient has failed nonoperative management and presents for total hip arthroplasty.   Laboratory Data: Admission on 07/11/2018, Discharged on 07/12/2018  Component Date Value Ref Range Status   WBC 07/12/2018 7.5  4.0 - 10.5 K/uL Final   WHITE COUNT CONFIRMED ON SMEAR   RBC 07/12/2018 3.39* 4.22 - 5.81 MIL/uL Final   Hemoglobin 07/12/2018 10.9* 13.0 - 17.0 g/dL Final   HCT 07/12/2018 33.1* 39.0 - 52.0 % Final   MCV 07/12/2018 97.6  80.0 - 100.0 fL Final   MCH 07/12/2018 32.2  26.0 - 34.0 pg Final   MCHC 07/12/2018 32.9  30.0 - 36.0 g/dL Final   RDW 07/12/2018 11.9  11.5 - 15.5 % Final   Platelets 07/12/2018 262  150 - 400 K/uL Final   nRBC 07/12/2018 0.0  0.0 - 0.2 % Final   Performed at [facility], [place] [address] [place], [place] [address]   Sodium 07/12/2018 135  135 - 145 mmol/L Final   Potassium 07/12/2018 4.1  3.5 - 5.1 mmol/L Final   Chloride 07/12/2018 101   98 - 111 mmol/L Final   CO2 07/12/2018 23  22 - 32 mmol/L Final   Glucose, Bld 07/12/2018 180* 70 - 99 mg/dL Final   BUN 07/12/2018 13  8 - 23 mg/dL Final   Creatinine, Ser 07/12/2018 1.05  0.61 - 1.24 mg/dL Final   Calcium 07/12/2018 8.0* 8.9 - 10.3 mg/dL Final   GFR calc non Af Amer 07/12/2018 >60  >60 mL/min Final   GFR calc Af Amer 07/12/2018 >60  >60 mL/min Final   Anion gap 07/12/2018 11  5 - 15 Final   Performed at [facility], [place] [address], [place], [place] [address]  Hospital Outpatient Visit on 07/07/2018  Component Date Value Ref Range Status   SARS-CoV-2, NAA 07/07/2018 NOT DETECTED  NOT DETECTED Final   Comment: (NOTE) This test was developed and its performance characteristics determined by [employer]. This test has not been FDA cleared or approved. This test has been authorized by FDA under an Emergency Use Authorization (EUA). This test is only authorized for the duration of time the declaration that circumstances exist justifying the authorization of the emergency use of in vitro diagnostic tests for detection of SARS-CoV-2 virus and/or diagnosis of COVID-19 infection under section 564(b)(1) of the Act, 21 U.S.C. 644IHK-7(Q)(2), unless the authorization is terminated or revoked sooner. When diagnostic testing is  negative, the possibility of a false negative result should be considered in the context of a patient's recent exposures and the presence of clinical signs and symptoms consistent with COVID-19. An individual without symptoms of COVID-19 and who is not shedding SARS-CoV-2 virus would expect to have a negative (not detected) result in this assay. Performed                           At: [facility] [place], [place] [id number] Rush Farmer MD [id number]    Coronavirus Source 07/07/2018 NASOPHARYNGEAL   Final   Performed at [facility], [place] [address], [place], [place] [address]    Hospital Outpatient Visit on 07/05/2018  Component Date Value Ref Range Status   aPTT 07/05/2018 33  24 - 36 seconds Final   Performed at [facility], [place] [address] [place], [place] [address]   WBC 07/05/2018 6.6  4.0 - 10.5 K/uL Final   RBC 07/05/2018 4.44  4.22 - 5.81 MIL/uL Final   Hemoglobin 07/05/2018 14.5  13.0 - 17.0 g/dL Final   HCT 07/05/2018 42.2  39.0 - 52.0 % Final   MCV 07/05/2018 95.0  80.0 - 100.0 fL Final   MCH 07/05/2018 32.7  26.0 - 34.0 pg Final   MCHC 07/05/2018 34.4  30.0 - 36.0 g/dL Final   RDW 07/05/2018 12.2  11.5 - 15.5 % Final   Platelets 07/05/2018 286  150 - 400 K/uL Final   nRBC 07/05/2018 0.0  0.0 - 0.2 % Final   Performed at [facility], [place] [address], [place], [place] [address]   Sodium 07/05/2018 135  135 - 145 mmol/L Final   Potassium 07/05/2018 4.0  3.5 - 5.1 mmol/L Final   Chloride 07/05/2018 99  98 - 111 mmol/L Final   CO2 07/05/2018 25  22 - 32 mmol/L Final   Glucose, Bld 07/05/2018 95  70 - 99 mg/dL Final   BUN 07/05/2018 13  8 - 23 mg/dL Final   Creatinine, Ser 07/05/2018 0.93  0.61 - 1.24 mg/dL Final   Calcium 07/05/2018 9.9  8.9 - 10.3 mg/dL Final   Total Protein 07/05/2018 7.5  6.5 - 8.1 g/dL Final   Albumin 07/05/2018 4.0  3.5 - 5.0 g/dL Final   AST 07/05/2018 24  15 - 41 U/L Final   ALT 07/05/2018 17  0 - 44 U/L Final   Alkaline Phosphatase 07/05/2018 58  38 - 126 U/L Final   Total Bilirubin 07/05/2018 0.8  0.3 - 1.2 mg/dL Final   GFR calc non Af Amer 07/05/2018 >60  >60 mL/min Final   GFR calc Af Amer 07/05/2018 >60  >60 mL/min Final   Anion gap 07/05/2018 11  5 - 15 Final   Performed at [facility], [place] [address] [place], [place] [address]   Prothrombin Time 07/05/2018 13.3  11.4 - 15.2 seconds Final   INR 07/05/2018 1.0  0.8 - 1.2 Final   Comment: (NOTE) INR goal varies based on device and disease states. Performed at [facility], [place] [address] [place], [place] [address]    ABO/RH(D) 07/05/2018 O POS   Final   Antibody Screen 07/05/2018 NEG   Final   Sample Expiration 07/05/2018 07/14/2018,2359   Final   Extend sample reason 07/05/2018    Final  Value:NO TRANSFUSIONS OR PREGNANCY IN THE PAST 3 MONTHS Performed at [place] [address], [place], [place] [address]    MRSA, PCR 07/05/2018 NEGATIVE  NEGATIVE Final   Staphylococcus aureus 07/05/2018 NEGATIVE  NEGATIVE Final   Comment: (NOTE) The Xpert SA Assay (FDA approved for NASAL specimens in patients 33 years of age and older), is one component of a comprehensive surveillance program. It is not intended to diagnose infection nor to guide or monitor treatment. Performed at [facility], [place] [address] [place], [place] [address]      X-Rays:Dg Pelvis Portable  Result Date: 07/11/2018 CLINICAL DATA:  Post RIGHT total hip replacement EXAM: PORTABLE PELVIS 1-2 VIEWS COMPARISON:  Portable exam 1604 hours compared to earlier intraoperative images of 07/11/2018 FINDINGS: BILATERAL hip prostheses identified. Soft tissue postsurgical changes and surgical drain project over the RIGHT hip joint. Bones appear demineralized. No fracture, dislocation or bone destruction. Scattered atherosclerotic calcifications. IMPRESSION: RIGHT hip prosthesis without acute abnormalities. Osseous demineralization with old LEFT hip prosthesis. Electronically Signed   By: Lavonia Dana M.D.   On: 07/11/2018 16:33   Dg C-arm 1-60 Min-no Report  Result Date: 07/11/2018 Fluoroscopy was utilized by the requesting physician.  No radiographic interpretation.   Dg Hip Operative Unilat With Pelvis Right  Result Date: 07/11/2018 CLINICAL DATA:  Hip replacement EXAM: OPERATIVE right HIP (WITH PELVIS IF PERFORMED) 2 VIEWS TECHNIQUE: Fluoroscopic spot image(s) were submitted for interpretation post-operatively.  COMPARISON:  None. FINDINGS: Right total hip arthroplasty hardware appears normally positioned. IMPRESSION: Intraoperative fluoroscopy Electronically Signed   By: Ulyses Jarred M.D.   On: 07/11/2018 17:12    EKG: Orders placed or performed during the hospital encounter of 07/05/18   EKG 12 lead   EKG 12 lead     Hospital Course: [name] is a 73 y.o. who was admitted to [facility]. They were brought to the operating room on 07/11/2018 and underwent Procedure(s): [place].  Patient tolerated the procedure well and was later transferred to the recovery room and then to the orthopaedic floor for postoperative care. They were given PO and IV analgesics for pain control following their surgery. They were given 24 hours of postoperative antibiotics of  Anti-infectives (From admission, onward)   Start     Dose/Rate Route Frequency Ordered Stop   07/11/18 2000  ceFAZolin (ANCEF) IVPB 2g/100 mL premix     2 g 200 mL/hr over 30 Minutes Intravenous Every 6 hours 07/11/18 1702 07/12/18 0243   07/11/18 1300  ceFAZolin (ANCEF) IVPB 2g/100 mL premix     2 g 200 mL/hr over 30 Minutes Intravenous On call to O.R. 07/11/18 1220 07/11/18 1354     and started on DVT prophylaxis in the form of Aspirin.   PT and OT were ordered for total joint protocol. Discharge planning consulted to help with postop disposition and equipment needs.  Patient had a good night on the evening of surgery. They started to get up OOB with therapy on POD #0. Pt was seen during rounds and was ready to go home pending progress with therapy. Hemovac drain was pulled without difficulty. He worked with therapy on POD #1 and was meeting his goals. Pt was discharged to home later that day in stable condition.  Diet: Regular diet Activity: WBAT Follow-up: in 2 weeks Disposition: Home with HEP Discharged Condition: stable   Discharge Instructions    Call MD / Call 911   Complete by:  As  directed    If you experience chest pain or shortness of breath, CALL 911 and be transported to the hospital emergency room.  If you develope a fever above 101 F, pus (white drainage) or increased drainage or redness at the wound, or calf pain, call your surgeon's office.   Change dressing   Complete by: As directed    You may change your dressing on Friday, then change the dressing daily with sterile 4 x 4 inch gauze dressing and paper tape.   Constipation Prevention   Complete by: As directed    Drink plenty of fluids.  Prune juice may be helpful.  You may use a stool softener, such as Colace (over the counter) 100 mg twice a day.  Use MiraLax (over the counter) for constipation as needed.   Diet - low sodium heart healthy   Complete by: As directed    Discharge instructions   Complete by: As directed    Dr. Gaynelle Arabian Total Joint Specialist [facility] [address], [place], [place] [address] [phone]  ANTERIOR APPROACH TOTAL HIP REPLACEMENT POSTOPERATIVE DIRECTIONS   Hip Rehabilitation, Guidelines Following Surgery  The results of a hip operation are greatly improved after range of motion and muscle strengthening exercises. Follow all safety measures which are given to protect your hip. If any of these exercises cause increased pain or swelling in your joint, decrease the amount until you are comfortable again. Then slowly increase the exercises. Call your caregiver if you have problems or questions.   HOME CARE INSTRUCTIONS  Remove items at home which could result in a fall. This includes throw rugs or furniture in walking pathways.  ICE to the affected hip every three hours for 30 minutes at a time and then as needed for pain and swelling.  Continue to use ice on the hip for pain and swelling from surgery. You may notice swelling that will progress down to the foot and ankle.  This is normal after surgery.  Elevate the leg when you are not up walking on it.     Continue to use the breathing machine which will help keep your temperature down.  It is common for your temperature to cycle up and down following surgery, especially at night when you are not up moving around and exerting yourself.  The breathing machine keeps your lungs expanded and your temperature down.  DIET You may resume your previous home diet once your are discharged from the hospital.  DRESSING / WOUND CARE / SHOWERING You may change your dressing 3-5 days after surgery.  Then change the dressing every day with sterile gauze.  Please use good hand washing techniques before changing the dressing.  Do not use any lotions or creams on the incision until instructed by your surgeon. You may start showering once you are discharged home but do not submerge the incision under water. Just pat the incision dry and apply a dry gauze dressing on daily. Change the surgical dressing daily and reapply a dry dressing each time.  ACTIVITY Walk with your walker as instructed. Use walker as long as suggested by your caregivers. Avoid periods of inactivity such as sitting longer than an hour when not asleep. This helps prevent blood clots.  You may resume a sexual relationship in one month or when given the OK by your doctor.  You may return to work once you are cleared by your doctor.  Do not drive a car for 6 weeks  or until released by you surgeon.  Do not drive while taking narcotics.  WEIGHT BEARING Weight bearing as tolerated with assist device (walker, cane, etc) as directed, use it as long as suggested by your surgeon or therapist, typically at least 4-6 weeks.  POSTOPERATIVE CONSTIPATION PROTOCOL Constipation - defined medically as fewer than three stools per week and severe constipation as less than one stool per week.  One of the most common issues patients have following surgery is constipation.  Even if you have a regular bowel pattern at home, your normal regimen is likely to be  disrupted due to multiple reasons following surgery.  Combination of anesthesia, postoperative narcotics, change in appetite and fluid intake all can affect your bowels.  In order to avoid complications following surgery, here are some recommendations in order to help you during your recovery period.  Colace (docusate) - Pick up an over-the-counter form of Colace or another stool softener and take twice a day as long as you are requiring postoperative pain medications.  Take with a full glass of water daily.  If you experience loose stools or diarrhea, hold the colace until you stool forms back up.  If your symptoms do not get better within 1 week or if they get worse, check with your doctor.  Dulcolax (bisacodyl) - Pick up over-the-counter and take as directed by the product packaging as needed to assist with the movement of your bowels.  Take with a full glass of water.  Use this product as needed if not relieved by Colace only.   MiraLax (polyethylene glycol) - Pick up over-the-counter to have on hand.  MiraLax is a solution that will increase the amount of water in your bowels to assist with bowel movements.  Take as directed and can mix with a glass of water, juice, soda, coffee, or tea.  Take if you go more than two days without a movement. Do not use MiraLax more than once per day. Call your doctor if you are still constipated or irregular after using this medication for 7 days in a row.  If you continue to have problems with postoperative constipation, please contact the office for further assistance and recommendations.  If you experience "the worst abdominal pain ever" or develop nausea or vomiting, please contact the office immediatly for further recommendations for treatment.  ITCHING  If you experience itching with your medications, try taking only a single pain pill, or even half a pain pill at a time.  You can also use Benadryl over the counter for itching or also to help with sleep.    TED HOSE STOCKINGS Wear the elastic stockings on both legs for three weeks following surgery during the day but you may remove then at night for sleeping.  MEDICATIONS See your medication summary on the "After Visit Summary" that the nursing staff will review with you prior to discharge.  You may have some home medications which will be placed on hold until you complete the course of blood thinner medication.  It is important for you to complete the blood thinner medication as prescribed by your surgeon.  Continue your approved medications as instructed at time of discharge.  PRECAUTIONS If you experience chest pain or shortness of breath - call 911 immediately for transfer to the hospital emergency department.  If you develop a fever greater that 101 F, purulent drainage from wound, increased redness or drainage from wound, foul odor from the wound/dressing, or calf pain - CONTACT YOUR SURGEON.  FOLLOW-UP APPOINTMENTS Make sure you keep all of your appointments after your operation with your surgeon and caregivers. You should call the office at the above phone number and make an appointment for approximately two weeks after the date of your surgery or on the date instructed by your surgeon outlined in the "After Visit Summary".  RANGE OF MOTION AND STRENGTHENING EXERCISES  These exercises are designed to help you keep full movement of your hip joint. Follow your caregiver's or physical therapist's instructions. Perform all exercises about fifteen times, three times per day or as directed. Exercise both hips, even if you have had only one joint replacement. These exercises can be done on a training (exercise) mat, on the floor, on a table or on a bed. Use whatever works the best and is most comfortable for you. Use music or television while you are exercising so that the exercises are a pleasant break in your day. This will make your life better with  the exercises acting as a break in routine you can look forward to.  Lying on your back, slowly slide your foot toward your buttocks, raising your knee up off the floor. Then slowly slide your foot back down until your leg is straight again.  Lying on your back spread your legs as far apart as you can without causing discomfort.  Lying on your side, raise your upper leg and foot straight up from the floor as far as is comfortable. Slowly lower the leg and repeat.  Lying on your back, tighten up the muscle in the front of your thigh (quadriceps muscles). You can do this by keeping your leg straight and trying to raise your heel off the floor. This helps strengthen the largest muscle supporting your knee.  Lying on your back, tighten up the muscles of your buttocks both with the legs straight and with the knee bent at a comfortable angle while keeping your heel on the floor.   IF YOU ARE TRANSFERRED TO A SKILLED REHAB FACILITY If the patient is transferred to a skilled rehab facility following release from the hospital, a list of the current medications will be sent to the facility for the patient to continue.  When discharged from the skilled rehab facility, please have the facility set up the patient's [place] prior to being released. Also, the skilled facility will be responsible for providing the patient with their medications at time of release from the facility to include their pain medication, the muscle relaxants, and their blood thinner medication. If the patient is still at the rehab facility at time of the two week follow up appointment, the skilled rehab facility will also need to assist the patient in arranging follow up appointment in our office and any transportation needs.  MAKE SURE YOU:  Understand these instructions.  Get help right away if you are not doing well or get worse.    Pick up stool softner and laxative for home use following surgery while on pain  medications. Do not submerge incision under water. Please use good hand washing techniques while changing dressing each day. May shower starting three days after surgery. Please use a clean towel to pat the incision dry following showers. Continue to use ice for pain and swelling after surgery. Do not use any lotions or creams on the incision until instructed by your surgeon.   Do not sit on low chairs, stoools or toilet seats, as it may be difficult to get up from low  surfaces   Complete by: As directed    Driving restrictions   Complete by: As directed    No driving for two weeks   TED hose   Complete by: As directed    Use stockings (TED hose) for three weeks on both leg(s).  You may remove them at night for sleeping.   Weight bearing as tolerated   Complete by: As directed      Allergies as of 07/12/2018      Reactions   Lisinopril Cough      Medication List    STOP taking these medications   meloxicam 15 MG tablet Commonly known as: MOBIC     TAKE these medications   amitriptyline 25 MG tablet Commonly known as: ELAVIL Take 25 mg by mouth at bedtime.   amLODipine 2.5 MG tablet Commonly known as: NORVASC Take 2.5 mg by mouth daily.   aspirin 325 MG EC tablet Take 1 tablet (325 mg total) by mouth 2 (two) times daily for 20 days. Then take one 81 mg aspirin once a day for three weeks.   azelastine 0.1 % nasal spray Commonly known as: ASTELIN Place 1 spray into both nostrils daily as needed for rhinitis or allergies.   fexofenadine 180 MG tablet Commonly known as: ALLEGRA Take 180 mg by mouth daily as needed (post-nasal drip).   fluticasone 50 MCG/ACT nasal spray Commonly known as: FLONASE Place 2 sprays into both nostrils daily. What changed:   how much to take  when to take this  reasons to take this   HYDROcodone-acetaminophen 5-325 MG tablet Commonly known as: NORCO/VICODIN Take 1-2 tablets by mouth every 6 (six) hours as needed for severe pain.     hyoscyamine 0.125 MG Tbdp disintergrating tablet Commonly known as: ANASPAZ Place 0.125 mg under the tongue every 6 (six) hours as needed for cramping (IBS).   losartan 100 MG tablet Commonly known as: COZAAR Take 100 mg by mouth daily.   methocarbamol 500 MG tablet Commonly known as: ROBAXIN Take 1 tablet (500 mg total) by mouth every 6 (six) hours as needed for muscle spasms.   montelukast 10 MG tablet Commonly known as: SINGULAIR Take 10 mg by mouth at bedtime as needed (post-nasal drip).   omeprazole 20 MG capsule Commonly known as: PRILOSEC Take 20 mg by mouth daily as needed (GERD).   traMADol 50 MG tablet Commonly known as: ULTRAM Take 1-2 tablets (50-100 mg total) by mouth every 6 (six) hours as needed for moderate pain.            Discharge Care Instructions  (From admission, onward)         Start     Ordered   07/12/18 0000  Weight bearing as tolerated     07/12/18 0801   07/12/18 0000  Change dressing    Comments: You may change your dressing on Friday, then change the dressing daily with sterile 4 x 4 inch gauze dressing and paper tape.   07/12/18 0801         Follow-up Information    Gaynelle Arabian, MD. Schedule an appointment as soon as possible for a visit on 07/26/2018.   Specialty: Orthopedic Surgery Contact information: [address] [place] [address] [phone]           Signed: Theresa Duty, PA-C Orthopedic Surgery 07/16/2018, 8:32 AM

## 2019-02-24 ENCOUNTER — Ambulatory Visit: Payer: Medicare Other

## 2019-03-01 ENCOUNTER — Ambulatory Visit: Payer: Medicare Other | Attending: Internal Medicine

## 2019-03-01 ENCOUNTER — Ambulatory Visit: Payer: Medicare Other

## 2019-03-01 DIAGNOSIS — Z23 Encounter for immunization: Secondary | ICD-10-CM

## 2019-03-01 NOTE — Progress Notes (Signed)
   Covid-19 Vaccination Clinic  Name:  [name]    MRN: [id number] DOB: 1946/01/09  03/01/2019  [name] was observed post Covid-19 immunization for 15 minutes without incidence. He was provided with Vaccine Information Sheet and instruction to access the V-Safe system.   [name] was instructed to call 911 with any severe reactions post vaccine: [place] Difficulty breathing  . Swelling of your face and throat  . A fast heartbeat  . A bad rash all over your body  . Dizziness and weakness    Immunizations Administered    Name Date Dose VIS Date Route   Pfizer COVID-19 Vaccine 03/01/2019 12:52 PM 0.3 mL 01/04/2019 Intramuscular   Manufacturer: [place]   Lot: [id number]   [place]: [id number]

## 2019-03-17 ENCOUNTER — Ambulatory Visit: Payer: Medicare Other

## 2019-03-26 ENCOUNTER — Ambulatory Visit: Payer: Medicare Other | Attending: Internal Medicine

## 2019-03-26 DIAGNOSIS — Z23 Encounter for immunization: Secondary | ICD-10-CM

## 2019-03-26 NOTE — Progress Notes (Signed)
   Covid-19 Vaccination Clinic  Name:  [name]    MRN: [id number] DOB: 1945-07-27  03/26/2019  Mr. Deutmeyer was observed post Covid-19 immunization for 15 minutes without incident. He was provided with Vaccine Information Sheet and instruction to access the V-Safe system.   Mr. Giachetti was instructed to call 911 with any severe reactions post vaccine: [place] Difficulty breathing  . Swelling of face and throat  . A fast heartbeat  . A bad rash all over body  . Dizziness and weakness   Immunizations Administered    Name Date Dose VIS Date Route   Pfizer COVID-19 Vaccine 03/26/2019 12:59 PM 0.3 mL 01/04/2019 Intramuscular   Manufacturer: [place]   Lot: [id number]   [place]: [id number]

## 2020-02-13 IMAGING — RF OPERATIVE RIGHT HIP WITH PELVIS
1 series · 2 of 2 positions shown · non-contrast
Comparison: None.

CLINICAL DATA: Hip replacement

EXAM:
OPERATIVE right HIP (WITH PELVIS IF PERFORMED) 2 VIEWS
TECHNIQUE: Fluoroscopic spot image(s) were submitted for interpretation
post-operatively.

[Series 1: unknown protocol · 0.20mm/px · 2 of 2 slices shown]
[im 1/2]
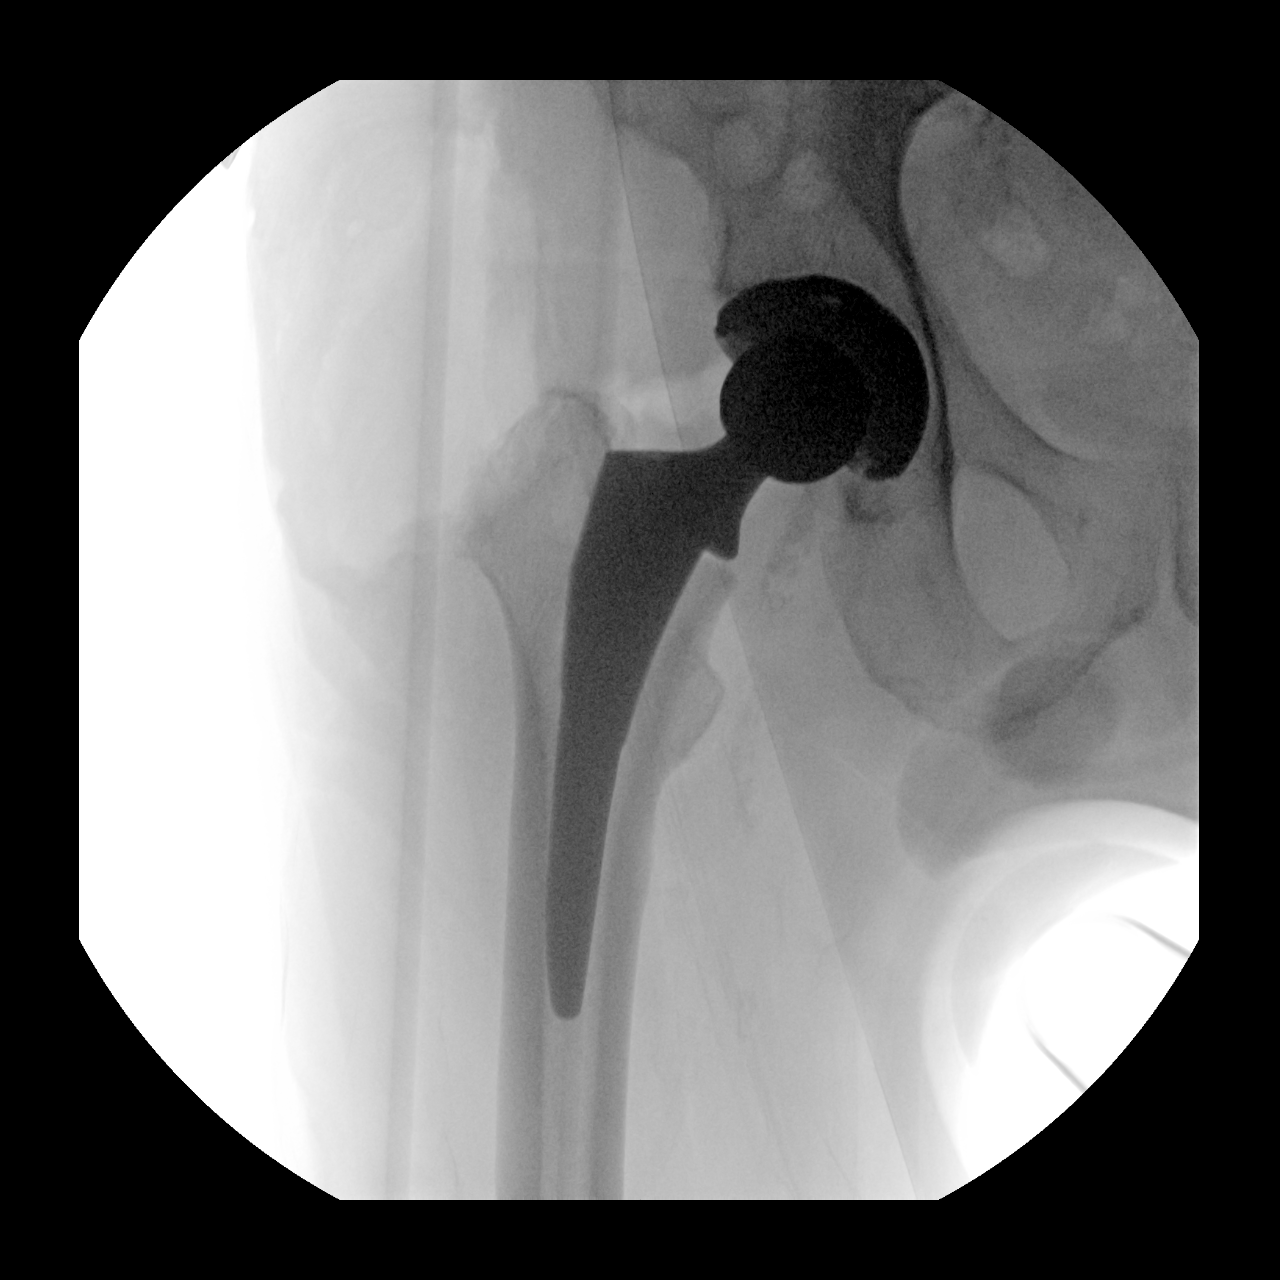
[im 2/2]
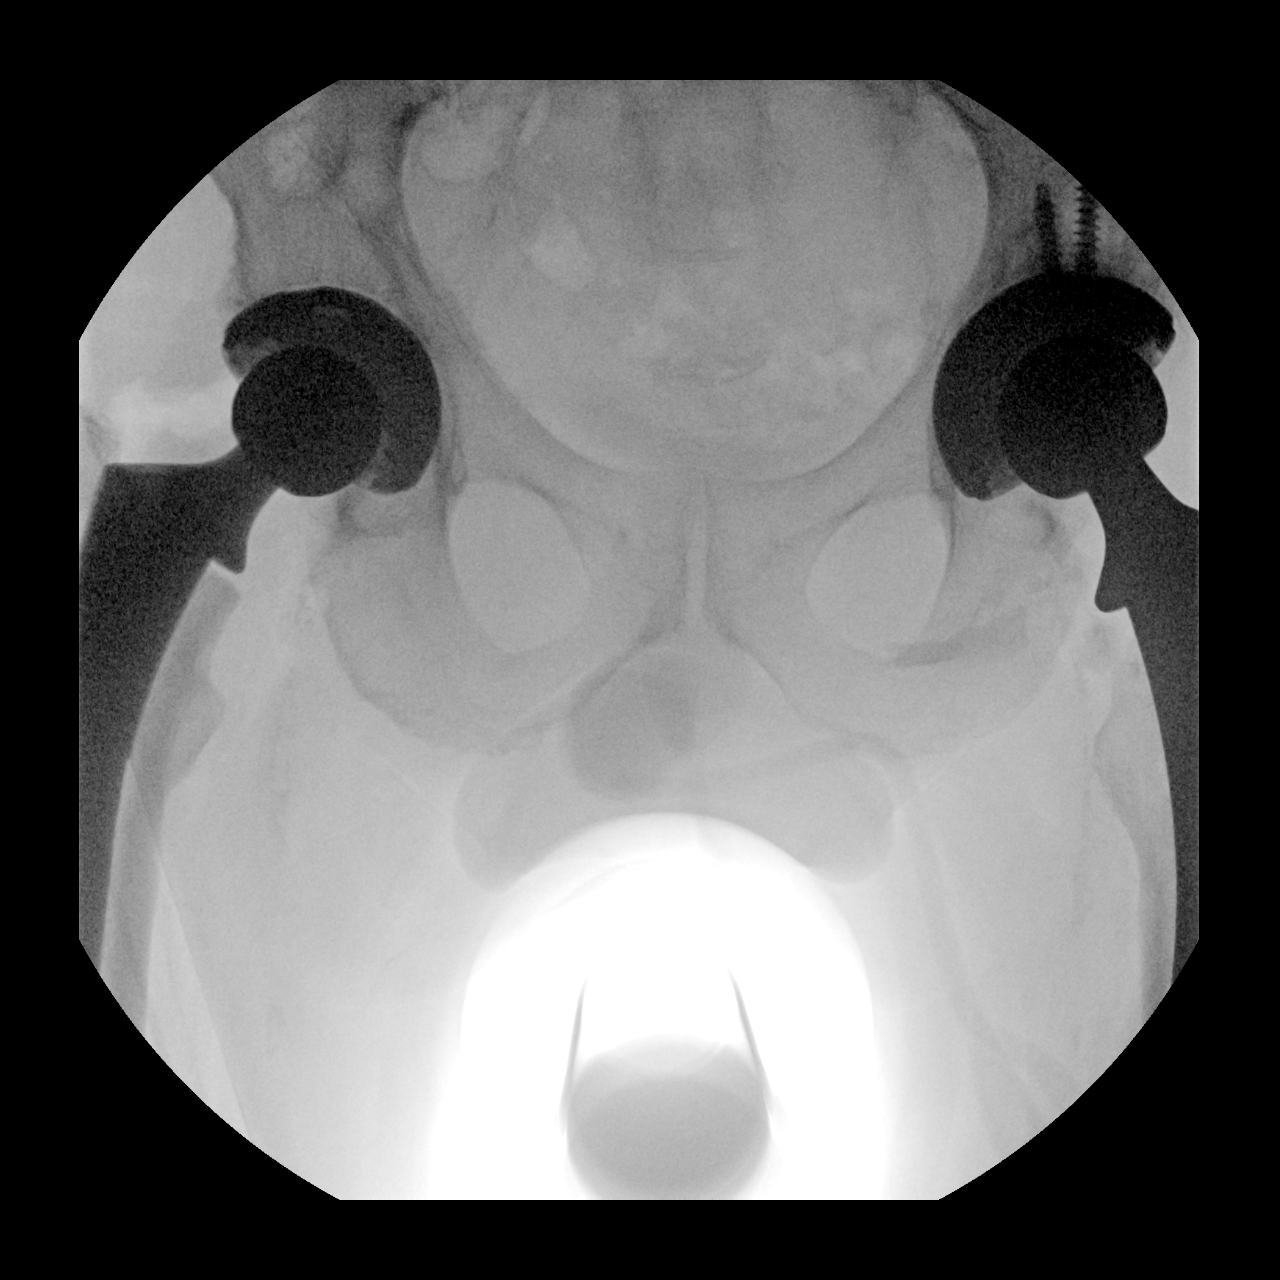

[2 of 2 positions shown; findings below may reference images not displayed]

FINDINGS: Right total hip arthroplasty hardware appears normally positioned.
IMPRESSION: Intraoperative fluoroscopy

## 2020-02-13 IMAGING — DX PORTABLE PELVIS 1-2 VIEWS
1 series · 1 of 1 positions shown · non-contrast
Comparison: Portable exam 0845 hours compared to earlier
intraoperative images of 07/11/2018

CLINICAL DATA: Post RIGHT total hip replacement

EXAM:
PORTABLE PELVIS 1-2 VIEWS

[pelvis ap]
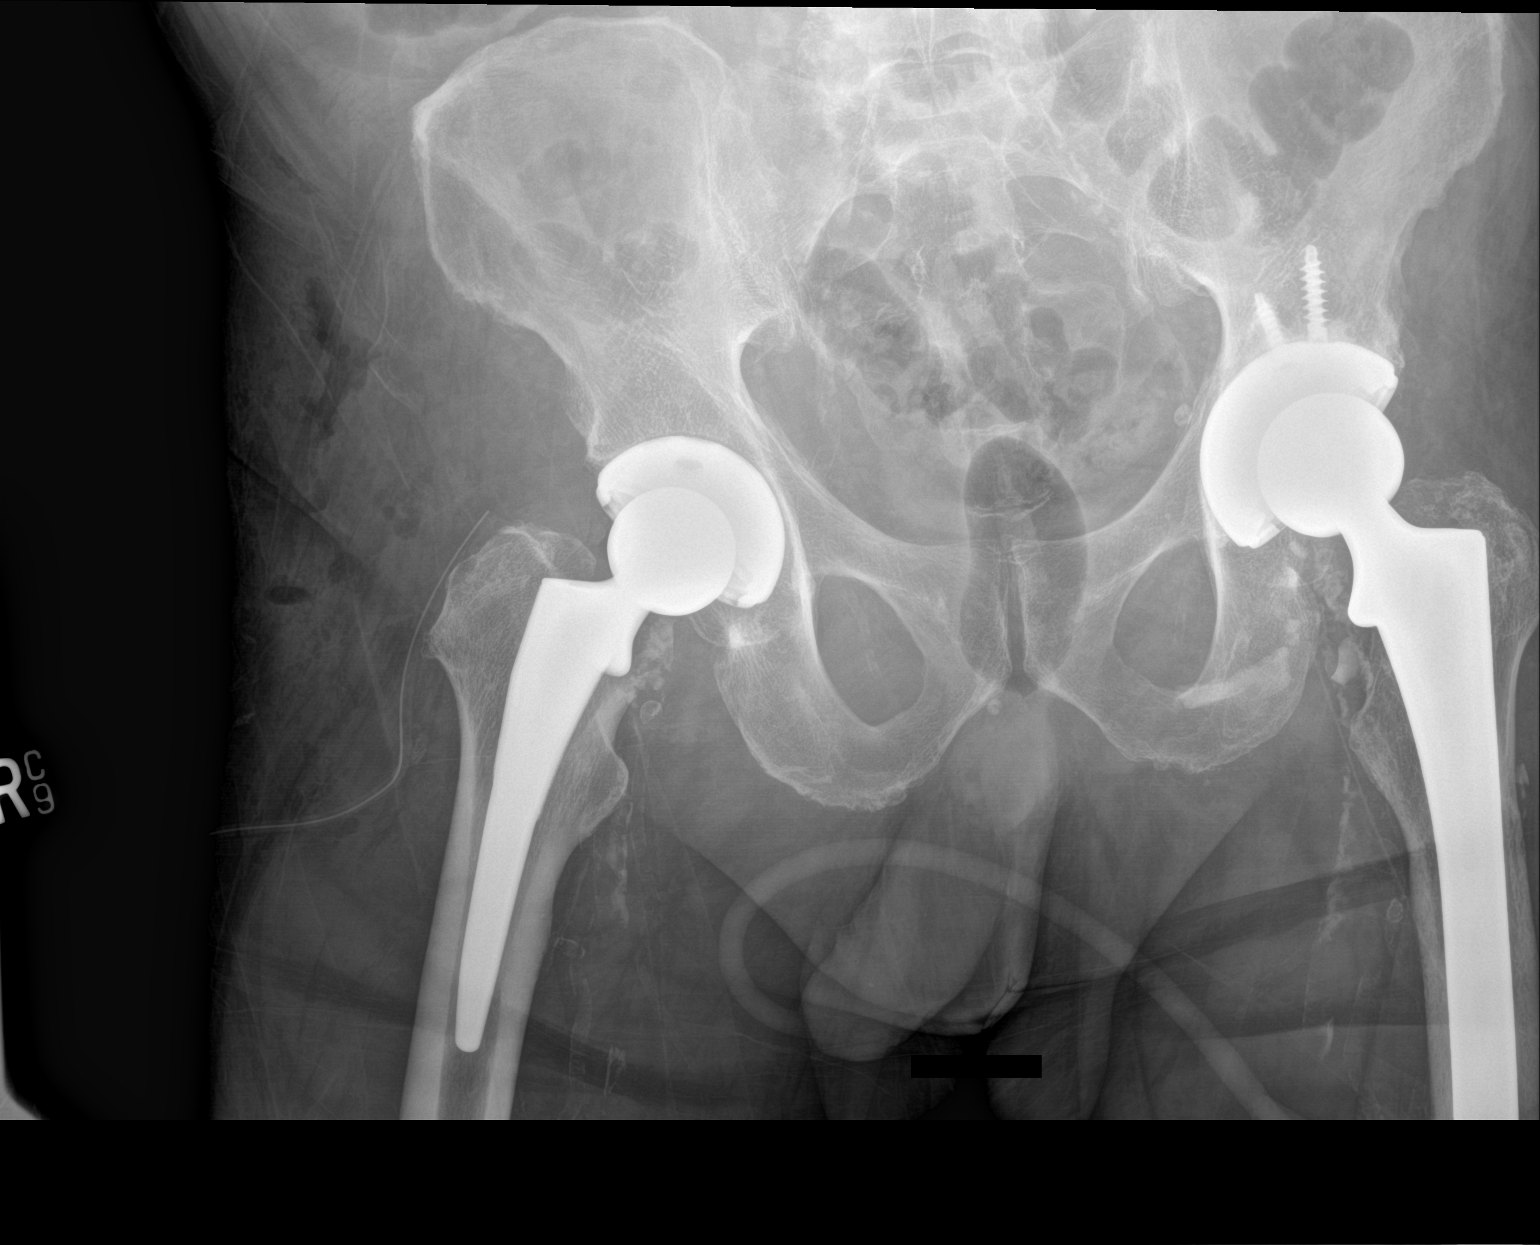

[1 of 1 positions shown; findings below may reference images not displayed]

FINDINGS: BILATERAL hip prostheses identified.

Soft tissue postsurgical changes and surgical drain project over the
RIGHT hip joint.

Bones appear demineralized.

No fracture, dislocation or bone destruction.

Scattered atherosclerotic calcifications.
IMPRESSION: RIGHT hip prosthesis without acute abnormalities.

Osseous demineralization with old LEFT hip prosthesis.

## 2022-12-02 ENCOUNTER — Emergency Department (HOSPITAL_BASED_OUTPATIENT_CLINIC_OR_DEPARTMENT_OTHER): Payer: Medicare Other

## 2022-12-02 ENCOUNTER — Encounter (HOSPITAL_BASED_OUTPATIENT_CLINIC_OR_DEPARTMENT_OTHER): Payer: Self-pay

## 2022-12-02 ENCOUNTER — Other Ambulatory Visit: Payer: Self-pay

## 2022-12-02 ENCOUNTER — Emergency Department (HOSPITAL_BASED_OUTPATIENT_CLINIC_OR_DEPARTMENT_OTHER)
Admission: EM | Admit: 2022-12-02 | Discharge: 2022-12-02 | Disposition: A | Discharge status: Home / Self Care | Payer: Medicare Other | Attending: Emergency Medicine | Admitting: Emergency Medicine

## 2022-12-02 DIAGNOSIS — I10 Essential (primary) hypertension: Secondary | ICD-10-CM | POA: Insufficient documentation

## 2022-12-02 DIAGNOSIS — M546 Pain in thoracic spine: Secondary | ICD-10-CM | POA: Diagnosis present

## 2022-12-02 DIAGNOSIS — Z8546 Personal history of malignant neoplasm of prostate: Secondary | ICD-10-CM | POA: Diagnosis not present

## 2022-12-02 MED ORDER — METHOCARBAMOL 500 MG PO TABS: 500.0000 mg | ORAL_TABLET | Freq: Every evening | ORAL | 0 refills | Status: AC

## 2022-12-02 MED ORDER — METHOCARBAMOL 500 MG PO TABS: 500.0000 mg | ORAL_TABLET | Freq: Every evening | ORAL | 0 refills | Status: DC

## 2022-12-02 MED ORDER — LIDOCAINE 4 % EX PTCH: 1.0000 | MEDICATED_PATCH | CUTANEOUS | 0 refills | Status: DC

## 2022-12-02 MED ORDER — HYDROCODONE-ACETAMINOPHEN 5-325 MG PO TABS
1.0000 | ORAL_TABLET | Freq: Once | ORAL | Status: AC
  Administered 2022-12-02: 1 via ORAL
  Filled 2022-12-02: qty 1

## 2022-12-02 MED ORDER — LIDOCAINE 4 % EX PTCH: 1.0000 | MEDICATED_PATCH | CUTANEOUS | 0 refills | Status: AC

## 2022-12-02 NOTE — ED Provider Notes (Signed)
Received handoff from Dr. Michael Litter: Disposition pending CT scan and reevaluation  Reevaluated; reports some improvement after pain medications. Physical Exam  BP (!) 148/66   Pulse 62   Temp 98.1 F (36.7 C)   Resp 18   SpO2 98%   Physical Exam Vitals and nursing note reviewed.  Constitutional:      General: He is not in acute distress.    Appearance: He is not toxic-appearing.  HENT:     Nose: Nose normal.     Mouth/Throat:     Mouth: Mucous membranes are moist.  Eyes:     Conjunctiva/sclera: Conjunctivae normal.  Cardiovascular:     Rate and Rhythm: Normal rate.  Pulmonary:     Effort: Pulmonary effort is normal.  Abdominal:     General: There is no distension.  Neurological:     Mental Status: He is alert and oriented to person, place, and time.     Procedures  Procedures  ED Course / MDM   Clinical Course as of 12/02/22 2252  [facility] Dec 02, 2022  1551 CT Thoracic Spine Wo Contrast IMPRESSION: 1. No evidence of acute thoracic spine fracture, traumatic subluxation or static signs of instability. 2. Old fractures of the left 11th rib posteriorly and right 7th rib laterally. 3. Chronic lung disease with progressive scarring and subpleural reticulation bilaterally. 4.  Aortic Atherosclerosis (ICD10-I70.0).   Electronically Signed   [TY]    Clinical Course User Index [TY] Coral Spikes, DO   Medical Decision Making 77 year old male present emergency department for back pain after fall.  Received handout from Dr. Jacqulyn Bath.  See Dr. Edwin Dada note for full HPI. CT scan negative for acute traumatic pathology.  Feeling better after medications.  Will discharge with supportive care and follow-up with PCP.  Amount and/or Complexity of Data Reviewed Radiology: ordered. Decision-making details documented in ED Course.  Risk OTC drugs. Prescription drug management.         Coral Spikes, DO 12/02/22 2252

## 2022-12-02 NOTE — Discharge Instructions (Addendum)
As discussed, you can alternate between over-the-counter Tylenol and ibuprofen.  Please take 1 and then the other every 4 hours with dose as directed on packaging.  We are prescribing a lidocaine patches that she may use throughout the day.  Also, we are prescribing a muscle relaxer.  He can predispose your age group to falls, sleepiness and altered mental status.  As such, please take before bed to help you sleep.  Return immediately if develop fevers, chills, chest pain, shortness of breath, severe pain, numbness, weakness in your legs or any new or worsening symptoms that are concerning to you.

## 2022-12-02 NOTE — ED Provider Notes (Signed)
Emergency Department Provider Note   I have reviewed the triage vital signs and the nursing notes.   HISTORY  Chief Complaint No chief complaint on file.   HPI [name] is a 77 y.o. male with past history reviewed below presents emergency department with mid back pain with some focal swelling.  Patient had a mechanical fall in the bathroom and around 1 AM.  He questions if he may have hit his mid back on something in the bathroom when he fell.  No presyncope symptoms, chest pain, palpitations.  No shortness of breath.  No anterior chest pain currently.  He took 2 Advil last night was able to go to sleep.  He did not strike his head or lose consciousness.  He has noticed some mid back stiffness and discomfort and so presents to the ED for evaluation.   Past Medical History:  Diagnosis Date   Cancer [facility])    prostate   Erectile dysfunction    GERD without esophagitis    History of kidney stones    Hyperlipidemia    Hypertension    Irritable bowel    PAD (peripheral artery disease) (HCC)    Pneumonia    Rhinitis     Review of Systems  Constitutional: No fever/chills Cardiovascular: Denies chest pain. Respiratory: Denies shortness of breath. Gastrointestinal: No abdominal pain.  No nausea, no vomiting.  No diarrhea.  No constipation. Genitourinary: Negative for dysuria. Musculoskeletal: Positive for back pain. Skin: Negative for rash. Neurological: Negative for headaches, focal weakness or numbness.   ____________________________________________   PHYSICAL EXAM:  VITAL SIGNS: ED Triage Vitals  Encounter Vitals Group     BP 12/02/22 1129 (!) 163/99     Pulse Rate 12/02/22 1129 65     Resp 12/02/22 1129 16     Temp 12/02/22 1129 98.1 F (36.7 C)     Temp src --      SpO2 12/02/22 1129 98 %   Constitutional: Alert and oriented. Well appearing and in no acute distress. Eyes: Conjunctivae are normal.  Head: Atraumatic. Nose: No  congestion/rhinnorhea. Mouth/Throat: Mucous membranes are moist. Neck: No stridor. No cervical spine tenderness to palpation. Cardiovascular: Normal rate, regular rhythm. Good peripheral circulation. Grossly normal heart sounds.   Respiratory: Normal respiratory effort.  No retractions. Lungs CTAB. Gastrointestinal: Soft and nontender. No distention.  Musculoskeletal: No lower extremity tenderness nor edema. No gross deformities of extremities. Mild tenderness along the mid-thoracic spine with slight asymmetric swelling without crepitus or ecchymosis.  Neurologic:  Normal speech and language. No gross focal neurologic deficits are appreciated.  Skin:  Skin is warm, dry and intact. No rash noted.  ____________________________________________   LABS (all labs ordered are listed, but only abnormal results are displayed)  Labs Reviewed - No data to display ____________________________________________  EKG  *** ____________________________________________  RADIOLOGY  No results found.  ____________________________________________   PROCEDURES  Procedure(s) performed:   Procedures   ____________________________________________   INITIAL IMPRESSION / ASSESSMENT AND PLAN / ED COURSE  Pertinent labs & imaging results that were available during my care of the patient were reviewed by me and considered in my medical decision making (see chart for details).   This patient is Presenting for Evaluation of ***, which {Range:23949} require a range of treatment options, and {MDMcomplaint:23950} a complaint that involves a {MDMlevelrisk:23951} risk of morbidity and mortality.  The Differential Diagnoses include***.  Critical Interventions-    Medications  HYDROcodone-acetaminophen (NORCO/VICODIN) 5-325 MG per tablet 1 tablet (1  tablet Oral Given 12/02/22 1214)    Reassessment after intervention:     I *** Additional Historical Information from ***, as the patient is ***.  I  decided to review pertinent External Data, and in summary ***.   Clinical Laboratory Tests Ordered, included   Radiologic Tests Ordered, included ***. I independently interpreted the images and agree with radiology interpretation.   Cardiac Monitor Tracing which shows ***   Social Determinants of Health Risk ***  Consult complete with  Medical Decision Making: Summary: ***  Reevaluation with update and discussion with   ***Considered admission***  Patient's presentation is most consistent with {EM COPA:27473}   Disposition:   ____________________________________________  FINAL CLINICAL IMPRESSION(S) / ED DIAGNOSES  Final diagnoses:  None     NEW OUTPATIENT MEDICATIONS STARTED DURING THIS VISIT:  New Prescriptions   No medications on file    Note:  This document was prepared using Dragon voice recognition software and may include unintentional dictation errors.  Alona Bene, MD, [facility] Emergency Medicine

## 2022-12-02 NOTE — ED Triage Notes (Signed)
Pt states he fell in bathroom approx 0100, c/o back pain to area. Swelling to R/ thoracic spinal area but no bruising. Denies SHOB  2 advil last night, no pain meds PTA

## 2023-01-02 NOTE — Therapy (Signed)
OUTPATIENT PHYSICAL THERAPY THORACOLUMBAR EVALUATION   Patient Name: [name] MRN: [id number] DOB:[birth year], 77 y.o., male Today's Date: 01/02/2023  END OF SESSION:   Past Medical History:  Diagnosis Date   Cancer [facility])    prostate   Erectile dysfunction    GERD without esophagitis    History of kidney stones    Hyperlipidemia    Hypertension    Irritable bowel    PAD (peripheral artery disease) (HCC)    Pneumonia    Rhinitis    Past Surgical History:  Procedure Laterality Date   both feet bunion surgery 2001     left hip unipolar hemiarthroplasty  1980   with revision 2013 with [name]   nasal surgery yrs ago     PROSTATECTOMY  2006   TOTAL HIP ARTHROPLASTY Right 07/11/2018   Procedure: TOTAL HIP ARTHROPLASTY ANTERIOR APPROACH;  Surgeon: Ollen Gross, MD;  Location: [facility] ORS;  Service: Orthopedics;  Laterality: Right;  100 mins/ to follow 1st case   Patient Active Problem List   Diagnosis Date Noted   OA (osteoarthritis) of hip 07/11/2018   Chronic cough 07/01/2013   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  ONSET DATE: ***  SUBJECTIVE:                                                                                                                                                                                           SUBJECTIVE STATEMENT: ***  PERTINENT HISTORY:  HTN; Hx prostate cancer; bilateral hip arthroplasty 23013, 2020;   PAIN:  Are you having pain? Yes: NPRS scale: ***/10 Pain location: *** Pain description: *** Aggravating factors: *** Relieving factors: ***  PRECAUTIONS: {Therapy precautions:24002}  RED FLAGS: {PT Red  Flags:29287}   WEIGHT BEARING RESTRICTIONS: {Yes ***/No:24003}  FALLS:  Has patient fallen in last 6 months? {fallsyesno:27318}  LIVING ENVIRONMENT: Lives with: {OPRC lives with:25569::"lives with their family"} Lives in: {Lives in:25570} Stairs: {opstairs:27293} Has following equipment at home: {Assistive devices:23999}  OCCUPATION: ***  PLOF: {PLOF:24004}  PATIENT GOALS: ***  NEXT MD VISIT: ***  OBJECTIVE:  Note: Objective measures were completed at Evaluation unless otherwise noted.  DIAGNOSTIC FINDINGS:  ***  PATIENT SURVEYS:  {rehab surveys:24030}  SCREENING FOR RED FLAGS: Bowel or bladder incontinence: {Yes/No:304960894} Spinal tumors: {Yes/No:304960894} Cauda equina syndrome: {Yes/No:304960894} Compression fracture: {Yes/No:304960894} Abdominal aneurysm: {Yes/No:304960894}  COGNITION: Overall cognitive status: {  cognition:24006}     SENSATION: {sensation:27233}  MUSCLE LENGTH: Hamstrings: Right *** deg; Left *** deg Maisie Fus test: Right *** deg; Left *** deg  POSTURE: {posture:25561}  PALPATION: ***  LUMBAR ROM:   AROM eval  Flexion   Extension   Right lateral flexion   Left lateral flexion   Right rotation   Left rotation    (Blank rows = not tested)  LOWER EXTREMITY ROM:     {AROM/PROM:27142}  Right eval Left eval  Hip flexion    Hip extension    Hip abduction    Hip adduction    Hip internal rotation    Hip external rotation    Knee flexion    Knee extension    Ankle dorsiflexion    Ankle plantarflexion    Ankle inversion    Ankle eversion     (Blank rows = not tested)  LOWER EXTREMITY MMT:    MMT Right eval Left eval  Hip flexion    Hip extension    Hip abduction    Hip adduction    Hip internal rotation    Hip external rotation    Knee flexion    Knee extension    Ankle dorsiflexion    Ankle plantarflexion    Ankle inversion    Ankle eversion     (Blank rows = not tested)  LUMBAR SPECIAL TESTS:  {lumbar  special test:25242}  FUNCTIONAL TESTS:  5 times sit to stand: *** Timed up and go (TUG): *** 3 minute walk test: ***  GAIT: Distance walked: *** Assistive device utilized: {Assistive devices:23999} Level of assistance: {Levels of assistance:24026} Comments: ***  TODAY'S TREATMENT:                                                                                                                              DATE: ***    PATIENT EDUCATION:  Education details: *** Person educated: {Person educated:25204} Education method: {Education Method:25205} Education comprehension: {Education Comprehension:25206}  HOME EXERCISE PROGRAM: ***  ASSESSMENT:  CLINICAL IMPRESSION: Patient is a 77 y.o. male who was seen today for physical therapy evaluation and treatment for back pain and balance deficits.   OBJECTIVE IMPAIRMENTS: {opptimpairments:25111}.   ACTIVITY LIMITATIONS: {activitylimitations:27494}  PARTICIPATION LIMITATIONS: {participationrestrictions:25113}  PERSONAL FACTORS: {Personal factors:25162} are also affecting patient's functional outcome.   REHAB POTENTIAL: {rehabpotential:25112}  CLINICAL DECISION MAKING: {clinical decision making:25114}  EVALUATION COMPLEXITY: {Evaluation complexity:25115}   GOALS: Goals reviewed with patient? {yes/no:20286}  SHORT TERM GOALS: Target date: ***  *** Baseline: Goal status: INITIAL  2.  *** Baseline:  Goal status: INITIAL  3.  *** Baseline:  Goal status: INITIAL  4.  *** Baseline:  Goal status: INITIAL  5.  *** Baseline:  Goal status: INITIAL  6.  *** Baseline:  Goal status: INITIAL  LONG TERM GOALS: Target date: ***  *** Baseline:  Goal status: INITIAL  2.  *** Baseline:  Goal status: INITIAL  3.  *** Baseline:  Goal status: INITIAL  4.  *** Baseline:  Goal status: INITIAL  5.  *** Baseline:  Goal status: INITIAL  6.  *** Baseline:  Goal status: INITIAL  PLAN:  PT FREQUENCY: {rehab  frequency:25116}  PT DURATION: {rehab duration:25117}  PLANNED INTERVENTIONS: 97164- PT Re-evaluation, 97110-Therapeutic exercises, 97530- Therapeutic activity, 97112- Neuromuscular re-education, 97535- Self Care, 19147- Manual therapy, [id number]- Gait training, [id number]- Canalith repositioning, [id number]- Aquatic Therapy, 97014- Electrical stimulation (unattended), [id number]- Electrical stimulation (manual), [id number]- Vasopneumatic device, [id number]- Ultrasound, [id number]- Traction (mechanical), [id number]- Ionotophoresis 4mg /ml Dexamethasone, Patient/Family education, Balance training, Stair training, Taping, Dry Needling, Joint mobilization, Joint manipulation, Spinal manipulation, Spinal mobilization, Vestibular training, Cryotherapy, and Moist heat.  PLAN FOR NEXT SESSION: Claude Manges, PT 01/02/2023, 2:55 PM

## 2023-01-03 ENCOUNTER — Ambulatory Visit: Payer: Medicare Other | Attending: Family Medicine | Admitting: Physical Therapy

## 2023-01-03 ENCOUNTER — Encounter: Payer: Self-pay | Admitting: Physical Therapy

## 2023-01-03 ENCOUNTER — Other Ambulatory Visit: Payer: Self-pay

## 2023-01-03 DIAGNOSIS — M6281 Muscle weakness (generalized): Secondary | ICD-10-CM | POA: Diagnosis present

## 2023-01-03 DIAGNOSIS — R293 Abnormal posture: Secondary | ICD-10-CM | POA: Diagnosis present

## 2023-01-03 DIAGNOSIS — M5459 Other low back pain: Secondary | ICD-10-CM | POA: Insufficient documentation

## 2023-01-03 DIAGNOSIS — R2689 Other abnormalities of gait and mobility: Secondary | ICD-10-CM | POA: Insufficient documentation

## 2023-01-05 ENCOUNTER — Ambulatory Visit: Payer: Medicare Other | Admitting: Physical Therapy

## 2023-01-05 ENCOUNTER — Encounter: Payer: Self-pay | Admitting: Physical Therapy

## 2023-01-05 DIAGNOSIS — R2689 Other abnormalities of gait and mobility: Secondary | ICD-10-CM | POA: Diagnosis not present

## 2023-01-05 DIAGNOSIS — M6281 Muscle weakness (generalized): Secondary | ICD-10-CM

## 2023-01-05 DIAGNOSIS — M5459 Other low back pain: Secondary | ICD-10-CM

## 2023-01-05 DIAGNOSIS — R293 Abnormal posture: Secondary | ICD-10-CM

## 2023-01-05 NOTE — Therapy (Signed)
OUTPATIENT PHYSICAL THERAPY THORACOLUMBAR & BALANCE TREATMENT   Patient Name: [name] MRN: [id number] DOB:[birth year], 77 y.o., male Today's Date: 01/05/2023  END OF SESSION:  PT End of Session - 01/05/23 1358     Visit Number 2    Date for PT Re-Evaluation 02/28/23    Authorization Type Medicare/ T[employer]are    Progress Note Due on Visit 10    PT Start Time 1147    PT Stop Time 1227    PT Time Calculation (min) 40 min    Activity Tolerance Patient tolerated treatment well    Behavior During Therapy [facility] for tasks assessed/performed              Past Medical History:  Diagnosis Date   Cancer [facility])    prostate   Erectile dysfunction    GERD without esophagitis    History of kidney stones    Hyperlipidemia    Hypertension    Irritable bowel    PAD (peripheral artery disease) (HCC)    Pneumonia    Rhinitis    Past Surgical History:  Procedure Laterality Date   both feet bunion surgery 2001     left hip unipolar hemiarthroplasty  1980   with revision 2013 with [name]   nasal surgery yrs ago     PROSTATECTOMY  2006   TOTAL HIP ARTHROPLASTY Right 07/11/2018   Procedure: TOTAL HIP ARTHROPLASTY ANTERIOR APPROACH;  Surgeon: Ollen Gross, MD;  Location: [facility];  Service: Orthopedics;  Laterality: Right;  100 mins/ to follow 1st case   Patient Active Problem List   Diagnosis Date Noted   OA (osteoarthritis) of hip 07/11/2018   Chronic cough 07/01/2013   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    PCP: Stevphen Rochester, MD  REFERRING PROVIDER: Stevphen Rochester, MD  REFERRING DIAG: R26.89 (ICD-10-CM) - Other abnormalities of gait and mobility M41.9 (ICD-10-CM) - Scoliosis, unspecified  Rationale for Evaluation and Treatment: Rehabilitation  THERAPY DIAG:  Other abnormalities of gait and mobility  Other low back pain  Muscle weakness (generalized)  Abnormal posture  ONSET DATE: December 01, 2022  SUBJECTIVE:                                                                                                                                                                                            SUBJECTIVE STATEMENT: Patient reports he is doing good today. He has been compliant with HEP and the app is very helpful to watch the videos of exercises.   From Eval: Patient fell December 01, 2022 and he went to the ED because his back was hurting. He  fell walking to the bathroom at night and he did not have any lights one. He tripped over his rug. They did a CT scan at the ER and everything was fine. Since his fall he has been having a lot of coughing attacks and he feels that it has hurt his back. He feels his balance is horrible and his endurance is very poor. His back pain has improved and he want to focus more on his balance. He feels off balance when he is walking, walking and having to turn his body, and bending over.   PERTINENT HISTORY:  HTN; Hx prostate cancer; bilateral hip arthroplasty 2013, 2020;   PAIN: 01/05/2023 Are you having pain? Yes: NPRS scale: 0/10 Pain location: achy Pain description: Rt side of low back Aggravating factors: coughing Relieving factors: lidocaine patches  PRECAUTIONS: Fall  RED FLAGS: None   WEIGHT BEARING RESTRICTIONS: No  FALLS:  Has patient fallen in last 6 months? Yes. Number of falls 1. See above. PT to address balance deficits   LIVING ENVIRONMENT: Lives with: lives with their family Lives in: House/apartment Stairs: Yes: Internal: 12 steps; on right going up Has following equipment at home: Single point cane he uses the cane when he has to do a lot of walking  OCCUPATION: Retired  PLOF: Independent Leisure: watching baseball  PATIENT GOALS: To improve my balance  NEXT MD VISIT: 4 months  OBJECTIVE:  Note: Objective measures were completed at Evaluation unless otherwise noted.  DIAGNOSTIC FINDINGS:  IMPRESSION: CT thoracic spine 12/02/2022 1. No evidence of acute thoracic  spine fracture, traumatic subluxation or static signs of instability. 2. Old fractures of the left 11th rib posteriorly and right 7th rib laterally. 3. Chronic lung disease with progressive scarring and subpleural reticulation bilaterally. 4.  Aortic Atherosclerosis (ICD10-I70.0).  PATIENT SURVEYS:  ABC scale 40.62% low level of physical functioning  SCREENING FOR RED FLAGS: Bowel or bladder incontinence: No Spinal tumors: No Cauda equina syndrome: No Compression fracture: No Abdominal aneurysm: No  COGNITION: Overall cognitive status: Within functional limits for tasks assessed     SENSATION: WFL  MUSCLE LENGTH: Hamstrings: decreased bilateral   POSTURE: rounded shoulders and forward head  PALPATION: Increased muscle spasm of lumbar paraspinals   LUMBAR ROM: * pain  AROM eval  Flexion Bends to shins  Extension 30% limited  Right lateral flexion Bends above knee joint line *  Left lateral flexion Bends above knee joint line *  Right rotation 30 % limited *  Left rotation WFL   (Blank rows = not tested)  LOWER EXTREMITY ROM:  WFL   LOWER EXTREMITY MMT:    MMT Right eval Left eval  Hip flexion 4 4  Hip extension    Hip abduction 4 4  Hip adduction    Hip internal rotation    Hip external rotation    Knee flexion 4+ 4+  Knee extension 4+ 4+  Ankle dorsiflexion    Ankle plantarflexion    Ankle inversion    Ankle eversion     (Blank rows = not tested)    FUNCTIONAL TESTS:  5 times sit to stand: 14.59 sec no UE support Timed up and go (TUG): 13.52 sec 3 minute walk test: 447 ft ; increased fatigue after 1.5 minutes; 2 minute mark legs were tired 01/05/2023 SPPB:6 /12 Short Physical Performance Battery:    Balance: total 2       Side by side stance:    points (1)  Semi -tandem:  points (0)  Tandem:      points (1)    Gait Speed: (71m=9.84 feet) done 2x take the best time:    2  points                                                                     Repeated Chair Stands:   2   points   (timer stopped when straight on 5th stand)                                              Total score=    6  points <10/12 predictive of 1 or more mobility limitations and increased risk of mobility disability 6 or less/12 associated with a higher fall rate  DGI: 14/24 GAIT: Distance walked: 460ft Assistive device utilized: None Level of assistance: Complete Independence Comments: Wide BOS; patient caught Rt foot while walking once; symmetrical step length; decreased cadence  TODAY'S TREATMENT:                                                                                                                              DATE:  01/05/2023 NuStep Level 4 5 mins- PT present to discuss status Reviewed HEP DGI: 14/24 falls risk Narrow BOS on airex x 30 sec EC on airex 8 secs; 25 sec increased sway max guarding from pt Modified tandem x 30 sec each  Short Physical Performance Battery:    Balance: total 2       Side by side stance:    points (1)  Semi -tandem:     points (0)  Tandem:      points (1)    Gait Speed: (18m=9.84 feet) done 2x take the best time:    2  points                                                                    Repeated Chair Stands:   2   points   (timer stopped when straight on 5th stand)                                              Total score=    6  points <10/12 predictive of  1 or more mobility limitations and increased risk of mobility disability 6 or less/12 associated with a higher fall rate                                        01/03/2023 Initial Evaluation & HEP created    PATIENT EDUCATION:  Education details: Balance systems; POC; HEP Person educated: Patient Education method: Explanation, Demonstration, and Handouts Education comprehension: verbalized understanding, returned demonstration, and needs further education  HOME EXERCISE PROGRAM: Access Code: [facility] URL:  [email or web address] Date: 01/03/2023 Prepared by: Claude Manges  Exercises - Supine Lower Trunk Rotation  - 1-2 x daily - 7 x weekly - 1 sets - 10 reps - 4-5 hold - Heel Raises with Counter Support  - 1-2 x daily - 7 x weekly - 1 sets - 10 reps - Sit to Stand  - 1-2 x daily - 7 x weekly - 1 sets - 5 reps - Seated Hamstring Stretch  - 1-2 x daily - 7 x weekly - 2 sets - 20-30 hold  ASSESSMENT:  CLINICAL IMPRESSION: Completed more comprehensive balance exam today. On short performance battery test patient scored 6/12 which indicates an increased risk of fall and decreased mobility. On DGI patient score 14/24 which indicates an increased risk of falls. Patient was unable to step over the object, he compensated with hip circumduction. And when walking around object he ran into once of the cones. Patient ascended and descended stairs with reciprocal pattern, but used unilateral UE support on handrail. Discussed these results with patient and her verbalized that "it's a good thing I am here." Educated patient on using his straight cane for daily ambulation and not just when he is walking for long periods of time. Patient will benefit from skilled PT to address the below impairments and improve overall function.    OBJECTIVE IMPAIRMENTS: Abnormal gait, decreased activity tolerance, decreased balance, decreased endurance, difficulty walking, decreased ROM, decreased strength, increased muscle spasms, impaired flexibility, prosthetic dependency , and pain.   ACTIVITY LIMITATIONS: bending, squatting, stairs, and transfers  PARTICIPATION LIMITATIONS: interpersonal relationship and community activity  PERSONAL FACTORS: Age and 1-2 comorbidities: HTN; Hx of cancer  are also affecting patient's functional outcome.   REHAB POTENTIAL: Good  CLINICAL DECISION MAKING: Stable/uncomplicated  EVALUATION COMPLEXITY: Low   GOALS: Goals reviewed with patient? Yes  SHORT TERM GOALS: Target  date: 01/31/2023  Patient will be independent with initial HEP. Baseline:  Goal status: INITIAL  2.  Patient will report > or = to 30% improvement in balance & back pain since starting PT. Baseline:  Goal status: INITIAL  3 Patient will score < or = to 11 sec on 5 STS for improved functional mobility. Baseline: 14.59 sec Goal status: INITIAL   LONG TERM GOALS: Target date: 02/28/2023  Patient will demonstrate independence in advanced HEP. Baseline:  Goal status: INITIAL  2.  Patient will report > or = to 70% improvement in balance & back pain since starting PT. Baseline:  Goal status: INITIAL   3.  Patient will score < or = to 12 sec on TUG for decreased falls risk. Baseline: 13.52 sec Goal status: INITIAL  4.Patient will score > or = to 50% on ABC scale for improved level of physical functioning.  Baseline: 40% Goal status: INITIAL  5.  Patient will be able to ascend stairs in a reciprocal pattern with UE support. Baseline:  Goal status: INITIAL  6.  Patient will ambulate > or = to 550 ft on for improved community ambulation.  Baseline:  Goal status: INITIAL  PLAN:  PT FREQUENCY: 2x/week  PT DURATION: 8 weeks  PLANNED INTERVENTIONS: 97164- PT Re-evaluation, 97110-Therapeutic exercises, 97530- Therapeutic activity, 97112- Neuromuscular re-education, 97535- Self Care, 32440- Manual therapy, [id number]- Gait training, [phone]- Canalith repositioning, [id number]- Aquatic Therapy, 97014- Electrical stimulation (unattended), [id number]- Electrical stimulation (manual), [id number]- Vasopneumatic device, [id number]- Ultrasound, [id number]- Traction (mechanical), [id number]- Ionotophoresis 4mg /ml Dexamethasone, Patient/Family education, Balance training, Stair training, Taping, Dry Needling, Joint mobilization, Joint manipulation, Spinal manipulation, Spinal mobilization, Vestibular training, Cryotherapy, and Moist heat.  PLAN FOR NEXT SESSION: modified tandem on airex; LE strengthening   Claude Manges,  PT 01/05/23 1:59 PM [facility] [address], [address] [place], [place] [address] Phone # [phone] Fax [phone]

## 2023-01-12 ENCOUNTER — Ambulatory Visit: Payer: Medicare Other | Admitting: Physical Therapy

## 2023-01-12 ENCOUNTER — Encounter: Payer: Self-pay | Admitting: Physical Therapy

## 2023-01-12 DIAGNOSIS — R293 Abnormal posture: Secondary | ICD-10-CM

## 2023-01-12 DIAGNOSIS — M5459 Other low back pain: Secondary | ICD-10-CM

## 2023-01-12 DIAGNOSIS — M6281 Muscle weakness (generalized): Secondary | ICD-10-CM

## 2023-01-12 DIAGNOSIS — R2689 Other abnormalities of gait and mobility: Secondary | ICD-10-CM

## 2023-01-12 NOTE — Therapy (Signed)
OUTPATIENT PHYSICAL THERAPY THORACOLUMBAR & BALANCE TREATMENT   Patient Name: [name] MRN: [id number] DOB:1945/04/16, 77 y.o., male Today's Date: 01/12/2023  END OF SESSION:  PT End of Session - 01/12/23 1143     Visit Number 3    Date for PT Re-Evaluation 02/28/23    Authorization Type Medicare/ Tricare    Progress Note Due on Visit 10    PT Start Time 1101    PT Stop Time 1143    PT Time Calculation (min) 42 min    Activity Tolerance Patient tolerated treatment well    Behavior During Therapy [facility] for tasks assessed/performed               Past Medical History:  Diagnosis Date   Cancer (HCC)    prostate   Erectile dysfunction    GERD without esophagitis    History of kidney stones    Hyperlipidemia    Hypertension    Irritable bowel    PAD (peripheral artery disease) (HCC)    Pneumonia    Rhinitis    Past Surgical History:  Procedure Laterality Date   both feet bunion surgery 2001     left hip unipolar hemiarthroplasty  1980   with revision 2013 with [name]   nasal surgery yrs ago     PROSTATECTOMY  2006   TOTAL HIP ARTHROPLASTY Right 07/11/2018   Procedure: TOTAL HIP ARTHROPLASTY ANTERIOR APPROACH;  Surgeon: Ollen Gross, MD;  Location: [facility];  Service: Orthopedics;  Laterality: Right;  100 mins/ to follow 1st case   Patient Active Problem List   Diagnosis Date Noted   OA (osteoarthritis) of hip 07/11/2018   Chronic cough 07/01/2013   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    PCP: Stevphen Rochester, MD  REFERRING PROVIDER: Stevphen Rochester, MD  REFERRING DIAG: R26.89 (ICD-10-CM) - Other abnormalities of gait and mobility M41.9 (ICD-10-CM) - Scoliosis, unspecified  Rationale for Evaluation and Treatment: Rehabilitation  THERAPY DIAG:  Other abnormalities of gait and mobility  Other low back pain  Abnormal posture  Muscle weakness (generalized)  ONSET DATE: December 01, 2022  SUBJECTIVE:                                                                                                                                                                                            SUBJECTIVE STATEMENT: Patient reports he is doing good today. No new compliants.   From Eval: Patient fell December 01, 2022 and he went to the ED because his back was hurting. He fell walking to the bathroom at night and he did not have any lights  one. He tripped over his rug. They did a CT scan at the ER and everything was fine. Since his fall he has been having a lot of coughing attacks and he feels that it has hurt his back. He feels his balance is horrible and his endurance is very poor. His back pain has improved and he want to focus more on his balance. He feels off balance when he is walking, walking and having to turn his body, and bending over.   PERTINENT HISTORY:  HTN; Hx prostate cancer; bilateral hip arthroplasty 2013, 2020;   PAIN: 01/12/2023 Are you having pain? Yes: NPRS scale: 0/10 Pain location: achy Pain description: Rt side of low back Aggravating factors: coughing Relieving factors: lidocaine patches  PRECAUTIONS: Fall  RED FLAGS: None   WEIGHT BEARING RESTRICTIONS: No  FALLS:  Has patient fallen in last 6 months? Yes. Number of falls 1. See above. PT to address balance deficits   LIVING ENVIRONMENT: Lives with: lives with their family Lives in: House/apartment Stairs: Yes: Internal: 12 steps; on right going up Has following equipment at home: Single point cane he uses the cane when he has to do a lot of walking  OCCUPATION: Retired  PLOF: Independent Leisure: watching baseball  PATIENT GOALS: To improve my balance  NEXT MD VISIT: 4 months  OBJECTIVE:  Note: Objective measures were completed at Evaluation unless otherwise noted.  DIAGNOSTIC FINDINGS:  IMPRESSION: CT thoracic spine 12/02/2022 1. No evidence of acute thoracic spine fracture, traumatic subluxation or static signs of instability. 2.  Old fractures of the left 11th rib posteriorly and right 7th rib laterally. 3. Chronic lung disease with progressive scarring and subpleural reticulation bilaterally. 4.  Aortic Atherosclerosis (ICD10-I70.0).  PATIENT SURVEYS:  ABC scale 40.62% low level of physical functioning  SCREENING FOR RED FLAGS: Bowel or bladder incontinence: No Spinal tumors: No Cauda equina syndrome: No Compression fracture: No Abdominal aneurysm: No  COGNITION: Overall cognitive status: Within functional limits for tasks assessed     SENSATION: WFL  MUSCLE LENGTH: Hamstrings: decreased bilateral   POSTURE: rounded shoulders and forward head  PALPATION: Increased muscle spasm of lumbar paraspinals   LUMBAR ROM: * pain  AROM eval  Flexion Bends to shins  Extension 30% limited  Right lateral flexion Bends above knee joint line *  Left lateral flexion Bends above knee joint line *  Right rotation 30 % limited *  Left rotation WFL   (Blank rows = not tested)  LOWER EXTREMITY ROM:  WFL   LOWER EXTREMITY MMT:    MMT Right eval Left eval  Hip flexion 4 4  Hip extension    Hip abduction 4 4  Hip adduction    Hip internal rotation    Hip external rotation    Knee flexion 4+ 4+  Knee extension 4+ 4+  Ankle dorsiflexion    Ankle plantarflexion    Ankle inversion    Ankle eversion     (Blank rows = not tested)    FUNCTIONAL TESTS:  5 times sit to stand: 14.59 sec no UE support Timed up and go (TUG): 13.52 sec 3 minute walk test: 447 ft ; increased fatigue after 1.5 minutes; 2 minute mark legs were tired 01/05/2023 SPPB:6 /12 Short Physical Performance Battery:    Balance: total 2       Side by side stance:    points (1)  Semi -tandem:     points (0)  Tandem:      points (1)  Gait Speed: (57m=9.84 feet) done 2x take the best time:    2  points                                                                    Repeated Chair Stands:   2   points   (timer stopped when straight  on 5th stand)                                              Total score=    6  points <10/12 predictive of 1 or more mobility limitations and increased risk of mobility disability 6 or less/12 associated with a higher fall rate  DGI: 14/24 GAIT: Distance walked: 428ft Assistive device utilized: None Level of assistance: Complete Independence Comments: Wide BOS; patient caught Rt foot while walking once; symmetrical step length; decreased cadence  TODAY'S TREATMENT:                                                                                                                              DATE:  01/12/2023 NuStep Level 4 5 mins- PT present to discuss status Standing hip series on airex : marching, abduction, extension  2 x 10  Heel raises on airex 2 x 10 4 square step x 5 each direction Step ups 6 inch step x 10 bilateral no UE support Hurdles (forwards & sideways) x 3 each direction no UE last set Y balance (2 cones in front one behind) unilateral UE support x 8 each Sit to stand no UE support x 10 Rockerboard (side to side) 2 mins   01/05/2023 NuStep Level 4 5 mins- PT present to discuss status Reviewed HEP DGI: 14/24 falls risk Narrow BOS on airex x 30 sec EC on airex 8 secs; 25 sec increased sway max guarding from pt Modified tandem x 30 sec each  Short Physical Performance Battery:    Balance: total 2       Side by side stance:    points (1)  Semi -tandem:     points (0)  Tandem:      points (1)    Gait Speed: (28m=9.84 feet) done 2x take the best time:    2  points  Repeated Chair Stands:   2   points   (timer stopped when straight on 5th stand)                                              Total score=    6  points <10/12 predictive of 1 or more mobility limitations and increased risk of mobility disability 6 or less/12 associated with a higher fall rate                                         01/03/2023 Initial Evaluation & HEP created    PATIENT EDUCATION:  Education details: Balance systems; POC; HEP Person educated: Patient Education method: Explanation, Demonstration, and Handouts Education comprehension: verbalized understanding, returned demonstration, and needs further education  HOME EXERCISE PROGRAM: Access Code: [facility] URL: [email or web address] Date: 01/03/2023 Prepared by: Claude Manges  Exercises - Supine Lower Trunk Rotation  - 1-2 x daily - 7 x weekly - 1 sets - 10 reps - 4-5 hold - Heel Raises with Counter Support  - 1-2 x daily - 7 x weekly - 1 sets - 10 reps - Sit to Stand  - 1-2 x daily - 7 x weekly - 1 sets - 5 reps - Seated Hamstring Stretch  - 1-2 x daily - 7 x weekly - 2 sets - 20-30 hold  ASSESSMENT:  CLINICAL IMPRESSION: Today's treatment session focused on balance and LE strengthening. Tharen was able to perform single leg balance activities without requiring UE support. Noted one LOB while performing Y balance exercise and patient required the use of stepping strategy to maintain balance. Activities performed on airex were a good challenge for patient. Patient will benefit from skilled PT to address the below impairments and improve overall function.     OBJECTIVE IMPAIRMENTS: Abnormal gait, decreased activity tolerance, decreased balance, decreased endurance, difficulty walking, decreased ROM, decreased strength, increased muscle spasms, impaired flexibility, prosthetic dependency , and pain.   ACTIVITY LIMITATIONS: bending, squatting, stairs, and transfers  PARTICIPATION LIMITATIONS: interpersonal relationship and community activity  PERSONAL FACTORS: Age and 1-2 comorbidities: HTN; Hx of cancer  are also affecting patient's functional outcome.   REHAB POTENTIAL: Good  CLINICAL DECISION MAKING: Stable/uncomplicated  EVALUATION COMPLEXITY: Low   GOALS: Goals reviewed with patient? Yes  SHORT TERM GOALS: Target date:  01/31/2023  Patient will be independent with initial HEP. Baseline:  Goal status: INITIAL  2.  Patient will report > or = to 30% improvement in balance & back pain since starting PT. Baseline:  Goal status: INITIAL  3 Patient will score < or = to 11 sec on 5 STS for improved functional mobility. Baseline: 14.59 sec Goal status: INITIAL   LONG TERM GOALS: Target date: 02/28/2023  Patient will demonstrate independence in advanced HEP. Baseline:  Goal status: INITIAL  2.  Patient will report > or = to 70% improvement in balance & back pain since starting PT. Baseline:  Goal status: INITIAL   3.  Patient will score < or = to 12 sec on TUG for decreased falls risk. Baseline: 13.52 sec Goal status: INITIAL  4.Patient will score > or = to 50% on ABC scale for improved level of physical functioning.  Baseline: 40% Goal status: INITIAL  5.  Patient will be  able to ascend stairs in a reciprocal pattern with UE support. Baseline:  Goal status: INITIAL  6.  Patient will ambulate > or = to 550 ft on for improved community ambulation.  Baseline:  Goal status: INITIAL  PLAN:  PT FREQUENCY: 2x/week  PT DURATION: 8 weeks  PLANNED INTERVENTIONS: 97164- PT Re-evaluation, 97110-Therapeutic exercises, 97530- Therapeutic activity, 97112- Neuromuscular re-education, 97535- Self Care, 16109- Manual therapy, [id number]- Gait training, [phone]- Canalith repositioning, [id number]- Aquatic Therapy, 97014- Electrical stimulation (unattended), [id number]- Electrical stimulation (manual), [id number]- Vasopneumatic device, [id number]- Ultrasound, [id number]- Traction (mechanical), [id number]- Ionotophoresis 4mg /ml Dexamethasone, Patient/Family education, Balance training, Stair training, Taping, Dry Needling, Joint mobilization, Joint manipulation, Spinal manipulation, Spinal mobilization, Vestibular training, Cryotherapy, and Moist heat.  PLAN FOR NEXT SESSION: modified tandem on airex; leg press; sit to stand with weight; cone  weaving   Claude Manges, PT 01/12/23 11:44 AM [facility] [address] [place], [place] [address] Phone # [phone] Fax [phone]

## 2023-01-27 ENCOUNTER — Ambulatory Visit: Payer: Medicare Other | Attending: Family Medicine | Admitting: Physical Therapy

## 2023-01-27 DIAGNOSIS — R2689 Other abnormalities of gait and mobility: Secondary | ICD-10-CM | POA: Insufficient documentation

## 2023-01-27 DIAGNOSIS — M6281 Muscle weakness (generalized): Secondary | ICD-10-CM | POA: Insufficient documentation

## 2023-01-27 DIAGNOSIS — M5459 Other low back pain: Secondary | ICD-10-CM | POA: Insufficient documentation

## 2023-01-27 DIAGNOSIS — R293 Abnormal posture: Secondary | ICD-10-CM | POA: Insufficient documentation

## 2023-01-27 NOTE — Therapy (Signed)
 OUTPATIENT PHYSICAL THERAPY THORACOLUMBAR & BALANCE TREATMENT   Patient Name: [name] MRN: [id number] DOB:10/30/1945, 78 y.o., male Today's Date: 01/27/2023  END OF SESSION:  PT End of Session - 01/27/23 1106     Visit Number 4    Authorization Type Medicare/ Tricare    Progress Note Due on Visit 10    PT Start Time 1105    PT Stop Time 1145    PT Time Calculation (min) 40 min    Activity Tolerance Patient tolerated treatment well               Past Medical History:  Diagnosis Date   Cancer (HCC)    prostate   Erectile dysfunction    GERD without esophagitis    History of kidney stones    Hyperlipidemia    Hypertension    Irritable bowel    PAD (peripheral artery disease) (HCC)    Pneumonia    Rhinitis    Past Surgical History:  Procedure Laterality Date   both feet bunion surgery 2001     left hip unipolar hemiarthroplasty  1980   with revision 2013 with [name]   nasal surgery yrs ago     PROSTATECTOMY  2006   TOTAL HIP ARTHROPLASTY Right 07/11/2018   Procedure: TOTAL HIP ARTHROPLASTY ANTERIOR APPROACH;  Surgeon: Melodi Lerner, MD;  Location: [facility];  Service: Orthopedics;  Laterality: Right;  100 mins/ to follow 1st case   Patient Active Problem List   Diagnosis Date Noted   OA (osteoarthritis) of hip 07/11/2018   Chronic cough 07/01/2013   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    PCP: Gladystine Erminio CROME, MD  REFERRING PROVIDER: Gladystine Erminio CROME, MD  REFERRING DIAG: R26.89 (ICD-10-CM) - Other abnormalities of gait and mobility M41.9 (ICD-10-CM) - Scoliosis, unspecified  Rationale for Evaluation and Treatment: Rehabilitation  THERAPY DIAG:  Other abnormalities of gait and mobility  Other low back pain  Abnormal posture  ONSET DATE: December 01, 2022  SUBJECTIVE:                                                                                                                                                                                            SUBJECTIVE STATEMENT: This cough is really bothering me.  Cough is worse when I first stand up from bed.  My back doesn't bother me today but I could tell it from the car ride to [place].  My balance is bad, I fell when I tripped at nightime in November.  I walk like I'm drunk sometimes.   From Eval: Patient fell December 01, 2022 and he  went to the ED because his back was hurting. He fell walking to the bathroom at night and he did not have any lights one. He tripped over his rug. They did a CT scan at the ER and everything was fine. Since his fall he has been having a lot of coughing attacks and he feels that it has hurt his back. He feels his balance is horrible and his endurance is very poor. His back pain has improved and he want to focus more on his balance. He feels off balance when he is walking, walking and having to turn his body, and bending over.   PERTINENT HISTORY:  HTN; Hx prostate cancer; bilateral hip arthroplasty 2013, 2020;   PAIN: 01/12/2023 Are you having pain? Yes: NPRS scale: 0/10 Pain location: achy Pain description: Rt side of low back Aggravating factors: coughing Relieving factors: lidocaine  patches  PRECAUTIONS: Fall  RED FLAGS: None   WEIGHT BEARING RESTRICTIONS: No  FALLS:  Has patient fallen in last 6 months? Yes. Number of falls 1. See above. PT to address balance deficits   LIVING ENVIRONMENT: Lives with: lives with their family Lives in: House/apartment Stairs: Yes: Internal: 12 steps; on right going up Has following equipment at home: Single point cane he uses the cane when he has to do a lot of walking  OCCUPATION: Retired  PLOF: Independent Leisure: watching baseball  PATIENT GOALS: To improve my balance  NEXT MD VISIT: 4 months  OBJECTIVE:  Note: Objective measures were completed at Evaluation unless otherwise noted.  DIAGNOSTIC FINDINGS:  IMPRESSION: CT thoracic spine 12/02/2022 1. No evidence of acute thoracic  spine fracture, traumatic subluxation or static signs of instability. 2. Old fractures of the left 11th rib posteriorly and right 7th rib laterally. 3. Chronic lung disease with progressive scarring and subpleural reticulation bilaterally. 4.  Aortic Atherosclerosis (ICD10-I70.0).  PATIENT SURVEYS:  ABC scale 40.62% low level of physical functioning  SCREENING FOR RED FLAGS: Bowel or bladder incontinence: No Spinal tumors: No Cauda equina syndrome: No Compression fracture: No Abdominal aneurysm: No  COGNITION: Overall cognitive status: Within functional limits for tasks assessed     SENSATION: WFL  MUSCLE LENGTH: Hamstrings: decreased bilateral   POSTURE: rounded shoulders and forward head  PALPATION: Increased muscle spasm of lumbar paraspinals   LUMBAR ROM: * pain  AROM eval  Flexion Bends to shins  Extension 30% limited  Right lateral flexion Bends above knee joint line *  Left lateral flexion Bends above knee joint line *  Right rotation 30 % limited *  Left rotation WFL   (Blank rows = not tested)  LOWER EXTREMITY ROM:  WFL   LOWER EXTREMITY MMT:    MMT Right eval Left eval  Hip flexion 4 4  Hip extension    Hip abduction 4 4  Hip adduction    Hip internal rotation    Hip external rotation    Knee flexion 4+ 4+  Knee extension 4+ 4+  Ankle dorsiflexion    Ankle plantarflexion    Ankle inversion    Ankle eversion     (Blank rows = not tested)    FUNCTIONAL TESTS:  5 times sit to stand: 14.59 sec no UE support Timed up and go (TUG): 13.52 sec 3 minute walk test: 447 ft ; increased fatigue after 1.5 minutes; 2 minute mark legs were tired 01/05/2023 SPPB:6 /12 Short Physical Performance Battery:    Balance: total 2       Side by side stance:  points (1)  Semi -tandem:     points (0)  Tandem:      points (1)    Gait Speed: (70m=9.84 feet) done 2x take the best time:    2  points                                                                     Repeated Chair Stands:   2   points   (timer stopped when straight on 5th stand)                                              Total score=    6  points <10/12 predictive of 1 or more mobility limitations and increased risk of mobility disability 6 or less/12 associated with a higher fall rate  DGI: 14/24 GAIT: Distance walked: 430ft Assistive device utilized: None Level of assistance: Complete Independence Comments: Wide BOS; patient caught Rt foot while walking once; symmetrical step length; decreased cadence  TODAY'S TREATMENT:     DATE:  01/27/2023: NuStep Level 3 8 mins- PT present to discuss status Bil heel raises 10x Balance warm ups next to the railing: walk on toes, high step walk, backwards walk Standing on airex :  UE reaches and head turns, eyes closed, narrow base of support and staggered stance 5 Circles on floor with toe taps 3 rounds of 5 Numbers on wall step and reach including cognitive component numbers coordinated to months of the year 5 rounds of 6   Resisted walk with 15# on cable pulley: backwards 5x, front walk outs 2 times (slow return to challenging)                                                                                                                      DATE:  01/12/2023 NuStep Level 4 5 mins- PT present to discuss status Standing hip series on airex : marching, abduction, extension  2 x 10  Heel raises on airex 2 x 10 4 square step x 5 each direction Step ups 6 inch step x 10 bilateral no UE support Hurdles (forwards & sideways) x 3 each direction no UE last set Y balance (2 cones in front one behind) unilateral UE support x 8 each Sit to stand no UE support x 10 Rockerboard (side to side) 2 mins   01/05/2023 NuStep Level 4 5 mins- PT present to discuss status Reviewed HEP DGI: 14/24 falls risk Narrow BOS on airex x 30 sec EC on airex 8 secs; 25 sec increased sway max guarding from pt Modified tandem x 30 sec each  Short Physical  Performance  Battery:    Balance: total 2       Side by side stance:    points (1)  Semi -tandem:     points (0)  Tandem:      points (1)    Gait Speed: (69m=9.84 feet) done 2x take the best time:    2  points                                                                    Repeated Chair Stands:   2   points   (timer stopped when straight on 5th stand)                                              Total score=    6  points <10/12 predictive of 1 or more mobility limitations and increased risk of mobility disability 6 or less/12 associated with a higher fall rate                                        01/03/2023 Initial Evaluation & HEP created    PATIENT EDUCATION:  Education details: Balance systems; POC; HEP Person educated: Patient Education method: Explanation, Demonstration, and Handouts Education comprehension: verbalized understanding, returned demonstration, and needs further education  HOME EXERCISE PROGRAM: Access Code: [facility] URL: [email or web address] Date: 01/03/2023 Prepared by: Kristeen Sar  Exercises - Supine Lower Trunk Rotation  - 1-2 x daily - 7 x weekly - 1 sets - 10 reps - 4-5 hold - Heel Raises with Counter Support  - 1-2 x daily - 7 x weekly - 1 sets - 10 reps - Sit to Stand  - 1-2 x daily - 7 x weekly - 1 sets - 5 reps - Seated Hamstring Stretch  - 1-2 x daily - 7 x weekly - 2 sets - 20-30 hold  ASSESSMENT:  CLINICAL IMPRESSION: Therapist providing close supervision and at times contact guard/min assist for safety in case of loss of balance with balance challenges.  There was 1 loss of balance requiring physical assistance to regain stability during session with a head turn.   Improved stability noted with repetition and practice.  He denies back pain during session and rates his level of perceived exertion as moderate 5/10 end of session.       OBJECTIVE IMPAIRMENTS: Abnormal gait, decreased activity tolerance, decreased balance,  decreased endurance, difficulty walking, decreased ROM, decreased strength, increased muscle spasms, impaired flexibility, prosthetic dependency , and pain.   ACTIVITY LIMITATIONS: bending, squatting, stairs, and transfers  PARTICIPATION LIMITATIONS: interpersonal relationship and community activity  PERSONAL FACTORS: Age and 1-2 comorbidities: HTN; Hx of cancer  are also affecting patient's functional outcome.   REHAB POTENTIAL: Good  CLINICAL DECISION MAKING: Stable/uncomplicated  EVALUATION COMPLEXITY: Low   GOALS: Goals reviewed with patient? Yes  SHORT TERM GOALS: Target date: 01/31/2023  Patient will be independent with initial HEP. Baseline:  Goal status: INITIAL  2.  Patient will report > or = to 30% improvement in balance & back pain since starting PT.  Baseline:  Goal status: INITIAL  3 Patient will score < or = to 11 sec on 5 STS for improved functional mobility. Baseline: 14.59 sec Goal status: INITIAL   LONG TERM GOALS: Target date: 02/28/2023  Patient will demonstrate independence in advanced HEP. Baseline:  Goal status: INITIAL  2.  Patient will report > or = to 70% improvement in balance & back pain since starting PT. Baseline:  Goal status: INITIAL   3.  Patient will score < or = to 12 sec on TUG for decreased falls risk. Baseline: 13.52 sec Goal status: INITIAL  4.Patient will score > or = to 50% on ABC scale for improved level of physical functioning.  Baseline: 40% Goal status: INITIAL  5.  Patient will be able to ascend stairs in a reciprocal pattern with UE support. Baseline:  Goal status: INITIAL  6.  Patient will ambulate > or = to 550 ft on for improved community ambulation.  Baseline:  Goal status: INITIAL  PLAN:  PT FREQUENCY: 2x/week  PT DURATION: 8 weeks  PLANNED INTERVENTIONS: 97164- PT Re-evaluation, 97110-Therapeutic exercises, 97530- Therapeutic activity, 97112- Neuromuscular re-education, 97535- Self Care, 02859-  Manual therapy, [id number]- Gait training, [phone]- Canalith repositioning, [id number]- Aquatic Therapy, 97014- Electrical stimulation (unattended), [id number]- Electrical stimulation (manual), [id number]- Vasopneumatic device, [id number]- Ultrasound, [id number]- Traction (mechanical), [id number]- Ionotophoresis 4mg /ml Dexamethasone , Patient/Family education, Balance training, Stair training, Taping, Dry Needling, Joint mobilization, Joint manipulation, Spinal manipulation, Spinal mobilization, Vestibular training, Cryotherapy, and Moist heat.  PLAN FOR NEXT SESSION: modified tandem on airex; leg press; sit to stand with weight; cone weaving  Glade Pesa, PT 01/27/23 12:08 PM Phone: [phone] Fax: [phone]

## 2023-01-31 ENCOUNTER — Encounter: Payer: Self-pay | Admitting: Physical Therapy

## 2023-01-31 ENCOUNTER — Ambulatory Visit: Payer: Medicare Other | Admitting: Physical Therapy

## 2023-01-31 DIAGNOSIS — M5459 Other low back pain: Secondary | ICD-10-CM

## 2023-01-31 DIAGNOSIS — M6281 Muscle weakness (generalized): Secondary | ICD-10-CM

## 2023-01-31 DIAGNOSIS — R2689 Other abnormalities of gait and mobility: Secondary | ICD-10-CM

## 2023-01-31 DIAGNOSIS — R293 Abnormal posture: Secondary | ICD-10-CM

## 2023-01-31 NOTE — Therapy (Signed)
 OUTPATIENT PHYSICAL THERAPY THORACOLUMBAR & BALANCE TREATMENT   Patient Name: [name] MRN: [id number] DOB:1945-12-24, 78 y.o., male Today's Date: 01/31/2023  END OF SESSION:  PT End of Session - 01/31/23 1318     Visit Number 5    Date for PT Re-Evaluation 02/28/23    Authorization Type Medicare/ Tricare    Progress Note Due on Visit 10    PT Start Time 1229    PT Stop Time 1314    PT Time Calculation (min) 45 min    Activity Tolerance Patient tolerated treatment well    Behavior During Therapy [facility] for tasks assessed/performed                Past Medical History:  Diagnosis Date   Cancer [facility])    prostate   Erectile dysfunction    GERD without esophagitis    History of kidney stones    Hyperlipidemia    Hypertension    Irritable bowel    PAD (peripheral artery disease) (HCC)    Pneumonia    Rhinitis    Past Surgical History:  Procedure Laterality Date   both feet bunion surgery 2001     left hip unipolar hemiarthroplasty  1980   with revision 2013 with [name]   nasal surgery yrs ago     PROSTATECTOMY  2006   TOTAL HIP ARTHROPLASTY Right 07/11/2018   Procedure: TOTAL HIP ARTHROPLASTY ANTERIOR APPROACH;  Surgeon: Melodi Lerner, MD;  Location: [facility];  Service: Orthopedics;  Laterality: Right;  100 mins/ to follow 1st case   Patient Active Problem List   Diagnosis Date Noted   OA (osteoarthritis) of hip 07/11/2018   Chronic cough 07/01/2013   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    PCP: Gladystine Erminio CROME, MD  REFERRING PROVIDER: Gladystine Erminio CROME, MD  REFERRING DIAG: R26.89 (ICD-10-CM) - Other abnormalities of gait and mobility M41.9 (ICD-10-CM) - Scoliosis, unspecified  Rationale for Evaluation and Treatment: Rehabilitation  THERAPY DIAG:  Other abnormalities of gait and mobility  Other low back pain  Abnormal posture  Muscle weakness (generalized)  ONSET DATE: December 01, 2022  SUBJECTIVE:                                                                                                                                                                                            SUBJECTIVE STATEMENT: I am doing good today. Last treatment session went well.   From Eval: Patient fell December 01, 2022 and he went to the ED because his back was hurting. He fell walking to the bathroom at night and he did not have any  lights one. He tripped over his rug. They did a CT scan at the ER and everything was fine. Since his fall he has been having a lot of coughing attacks and he feels that it has hurt his back. He feels his balance is horrible and his endurance is very poor. His back pain has improved and he want to focus more on his balance. He feels off balance when he is walking, walking and having to turn his body, and bending over.   PERTINENT HISTORY:  HTN; Hx prostate cancer; bilateral hip arthroplasty 2013, 2020;   PAIN: 01/31/2023 Are you having pain? Yes: NPRS scale: 0/10 Pain location: achy Pain description: Rt side of low back Aggravating factors: coughing Relieving factors: lidocaine  patches  PRECAUTIONS: Fall  RED FLAGS: None   WEIGHT BEARING RESTRICTIONS: No  FALLS:  Has patient fallen in last 6 months? Yes. Number of falls 1. See above. PT to address balance deficits   LIVING ENVIRONMENT: Lives with: lives with their family Lives in: House/apartment Stairs: Yes: Internal: 12 steps; on right going up Has following equipment at home: Single point cane he uses the cane when he has to do a lot of walking  OCCUPATION: Retired  PLOF: Independent Leisure: watching baseball  PATIENT GOALS: To improve my balance  NEXT MD VISIT: 4 months  OBJECTIVE:  Note: Objective measures were completed at Evaluation unless otherwise noted.  DIAGNOSTIC FINDINGS:  IMPRESSION: CT thoracic spine 12/02/2022 1. No evidence of acute thoracic spine fracture, traumatic subluxation or static signs of instability. 2. Old  fractures of the left 11th rib posteriorly and right 7th rib laterally. 3. Chronic lung disease with progressive scarring and subpleural reticulation bilaterally. 4.  Aortic Atherosclerosis (ICD10-I70.0).  PATIENT SURVEYS:  ABC scale 40.62% low level of physical functioning  SCREENING FOR RED FLAGS: Bowel or bladder incontinence: No Spinal tumors: No Cauda equina syndrome: No Compression fracture: No Abdominal aneurysm: No  COGNITION: Overall cognitive status: Within functional limits for tasks assessed     SENSATION: WFL  MUSCLE LENGTH: Hamstrings: decreased bilateral   POSTURE: rounded shoulders and forward head  PALPATION: Increased muscle spasm of lumbar paraspinals   LUMBAR ROM: * pain  AROM eval  Flexion Bends to shins  Extension 30% limited  Right lateral flexion Bends above knee joint line *  Left lateral flexion Bends above knee joint line *  Right rotation 30 % limited *  Left rotation WFL   (Blank rows = not tested)  LOWER EXTREMITY ROM:  WFL   LOWER EXTREMITY MMT:    MMT Right eval Left eval  Hip flexion 4 4  Hip extension    Hip abduction 4 4  Hip adduction    Hip internal rotation    Hip external rotation    Knee flexion 4+ 4+  Knee extension 4+ 4+  Ankle dorsiflexion    Ankle plantarflexion    Ankle inversion    Ankle eversion     (Blank rows = not tested)    FUNCTIONAL TESTS:  5 times sit to stand: 14.59 sec no UE support Timed up and go (TUG): 13.52 sec 3 minute walk test: 447 ft ; increased fatigue after 1.5 minutes; 2 minute mark legs were tired 01/05/2023 SPPB:6 /12 Short Physical Performance Battery:    Balance: total 2       Side by side stance:    points (1)  Semi -tandem:     points (0)  Tandem:      points (1)  Gait Speed: (51m=9.84 feet) done 2x take the best time:    2  points                                                                    Repeated Chair Stands:   2   points   (timer stopped when straight on  5th stand)                                              Total score=    6  points <10/12 predictive of 1 or more mobility limitations and increased risk of mobility disability 6 or less/12 associated with a higher fall rate  DGI: 14/24 GAIT: Distance walked: 459ft Assistive device utilized: None Level of assistance: Complete Independence Comments: Wide BOS; patient caught Rt foot while walking once; symmetrical step length; decreased cadence  TODAY'S TREATMENT:     DATE:  01/31/2023: NuStep Level 3 8 mins- PT present to discuss status Leg Press seat 6 60# 2 x 10 bilateral  Modified tandem on airex 2 x 30 each Cone weaving X 3 Coe In & Outs x 2  Cone taps (2 forwards,1 lateral) x 8 each Finger tip hand hold 6inch step ups finger tip hand hold x 10 bilateral  Bil heel raises 2x10 Hurdles Forwards/ Sideways (reciprocal pattern) x 4 no UE support Sit to Stand + Chest Press 4# DB 2 x 10 Standing shoulder rows & extension with red TB 2 x 10   01/27/2023: NuStep Level 3 8 mins- PT present to discuss status Bil heel raises 10x Balance warm ups next to the railing: walk on toes, high step walk, backwards walk Standing on airex :  UE reaches and head turns, eyes closed, narrow base of support and staggered stance 5 Circles on floor with toe taps 3 rounds of 5 Numbers on wall step and reach including cognitive component numbers coordinated to months of the year 5 rounds of 6   Resisted walk with 15# on cable pulley: backwards 5x, front walk outs 2 times (slow return to challenging)                                                                                                                   01/12/2023 NuStep Level 4 5 mins- PT present to discuss status Standing hip series on airex : marching, abduction, extension  2 x 10  Heel raises on airex 2 x 10 4 square step x 5 each direction Step ups 6 inch step x 10 bilateral no UE support Hurdles (forwards & sideways) x 3 each direction no UE  last set Y balance (2 cones  in front one behind) unilateral UE support x 8 each Sit to stand no UE support x 10 Rockerboard (side to side) 2 mins                                          PATIENT EDUCATION:  Education details: Balance systems; POC; HEP Person educated: Patient Education method: Explanation, Demonstration, and Handouts Education comprehension: verbalized understanding, returned demonstration, and needs further education  HOME EXERCISE PROGRAM: Access Code: [facility] URL: [email or web address] Date: 01/31/2023 Prepared by: Kristeen Sar  Exercises - Supine Lower Trunk Rotation  - 1-2 x daily - 7 x weekly - 1 sets - 10 reps - 4-5 hold - Heel Raises with Counter Support  - 1-2 x daily - 7 x weekly - 1 sets - 10 reps - Sit to Stand  - 1-2 x daily - 7 x weekly - 1 sets - 5 reps - Seated Hamstring Stretch  - 1-2 x daily - 7 x weekly - 2 sets - 20-30 hold - Standing Shoulder Row with Anchored Resistance  - 1 x daily - 7 x weekly - 2 sets - 10 reps - Shoulder extension with resistance - Neutral  - 1 x daily - 7 x weekly - 2 sets - 10 reps  ASSESSMENT:  CLINICAL IMPRESSION: Today's treatment session focused on balance and LE strengthening.  Incorporated cone weaving, leg press, and modified tandem on unstable surface. Patient required SBA with balance exercises. No LOB noted. Updated patient's HEP to include postural strengthening exercises. Patient is motivated and compliant with HEP. Patient will benefit from skilled PT to address the below impairments and improve overall function.      OBJECTIVE IMPAIRMENTS: Abnormal gait, decreased activity tolerance, decreased balance, decreased endurance, difficulty walking, decreased ROM, decreased strength, increased muscle spasms, impaired flexibility, prosthetic dependency , and pain.   ACTIVITY LIMITATIONS: bending, squatting, stairs, and transfers  PARTICIPATION LIMITATIONS: interpersonal relationship and  community activity  PERSONAL FACTORS: Age and 1-2 comorbidities: HTN; Hx of cancer  are also affecting patient's functional outcome.   REHAB POTENTIAL: Good  CLINICAL DECISION MAKING: Stable/uncomplicated  EVALUATION COMPLEXITY: Low   GOALS: Goals reviewed with patient? Yes  SHORT TERM GOALS: Target date: 01/31/2023  Patient will be independent with initial HEP. Baseline:  Goal status: INITIAL  2.  Patient will report > or = to 30% improvement in balance & back pain since starting PT. Baseline:  Goal status: INITIAL  3 Patient will score < or = to 11 sec on 5 STS for improved functional mobility. Baseline: 14.59 sec Goal status: INITIAL   LONG TERM GOALS: Target date: 02/28/2023  Patient will demonstrate independence in advanced HEP. Baseline:  Goal status: INITIAL  2.  Patient will report > or = to 70% improvement in balance & back pain since starting PT. Baseline:  Goal status: INITIAL   3.  Patient will score < or = to 12 sec on TUG for decreased falls risk. Baseline: 13.52 sec Goal status: INITIAL  4.Patient will score > or = to 50% on ABC scale for improved level of physical functioning.  Baseline: 40% Goal status: INITIAL  5.  Patient will be able to ascend stairs in a reciprocal pattern with UE support. Baseline:  Goal status: INITIAL  6.  Patient will ambulate > or = to 550 ft on for improved community ambulation.  Baseline:  Goal status:  INITIAL  PLAN:  PT FREQUENCY: 2x/week  PT DURATION: 8 weeks  PLANNED INTERVENTIONS: 97164- PT Re-evaluation, 97110-Therapeutic exercises, 97530- Therapeutic activity, 97112- Neuromuscular re-education, 97535- Self Care, 02859- Manual therapy, [id number]- Gait training, [id number]- Canalith repositioning, [id number]- Aquatic Therapy, 97014- Electrical stimulation (unattended), [id number]- Electrical stimulation (manual), [id number]- Vasopneumatic device, [id number]- Ultrasound, [id number]- Traction (mechanical), [id number]- Ionotophoresis 4mg /ml  Dexamethasone , Patient/Family education, Balance training, Stair training, Taping, Dry Needling, Joint mobilization, Joint manipulation, Spinal manipulation, Spinal mobilization, Vestibular training, Cryotherapy, and Moist heat.  PLAN FOR NEXT SESSION: assess 5STS; TUG  Kristeen Sar, PT 01/31/23 1:19 PM

## 2023-02-02 ENCOUNTER — Encounter: Payer: Medicare Other | Admitting: Physical Therapy

## 2023-02-06 ENCOUNTER — Encounter: Payer: Medicare Other | Admitting: Physical Therapy

## 2023-02-07 ENCOUNTER — Encounter: Payer: Medicare Other | Admitting: Physical Therapy

## 2023-02-07 ENCOUNTER — Ambulatory Visit: Payer: Medicare Other | Admitting: Physical Therapy

## 2023-02-09 ENCOUNTER — Encounter: Payer: Self-pay | Admitting: Physical Therapy

## 2023-02-09 ENCOUNTER — Ambulatory Visit: Payer: Medicare Other | Admitting: Physical Therapy

## 2023-02-09 DIAGNOSIS — R2689 Other abnormalities of gait and mobility: Secondary | ICD-10-CM

## 2023-02-09 DIAGNOSIS — M5459 Other low back pain: Secondary | ICD-10-CM

## 2023-02-09 DIAGNOSIS — R293 Abnormal posture: Secondary | ICD-10-CM

## 2023-02-09 DIAGNOSIS — M6281 Muscle weakness (generalized): Secondary | ICD-10-CM

## 2023-02-09 NOTE — Therapy (Signed)
OUTPATIENT PHYSICAL THERAPY THORACOLUMBAR & BALANCE TREATMENT   Patient Name: [name] MRN: [id number] DOB:12-22-1945, 78 y.o., male Today's Date: 02/09/2023  END OF SESSION:  PT End of Session - 02/09/23 1320     Visit Number 6    Date for PT Re-Evaluation 02/28/23    Authorization Type Medicare/ [employer]    Progress Note Due on Visit 10    PT Start Time 1231    PT Stop Time 1316    PT Time Calculation (min) 45 min    Activity Tolerance Patient tolerated treatment well    Behavior During Therapy [facility] for tasks assessed/performed                 Past Medical History:  Diagnosis Date   Cancer (HCC)    prostate   Erectile dysfunction    GERD without esophagitis    History of kidney stones    Hyperlipidemia    Hypertension    Irritable bowel    PAD (peripheral artery disease) (HCC)    Pneumonia    Rhinitis    Past Surgical History:  Procedure Laterality Date   both feet bunion surgery 2001     left hip unipolar hemiarthroplasty  1980   with revision 2013 with [name]   nasal surgery yrs ago     PROSTATECTOMY  2006   TOTAL HIP ARTHROPLASTY Right 07/11/2018   Procedure: TOTAL HIP ARTHROPLASTY ANTERIOR APPROACH;  Surgeon: Ollen Gross, MD;  Location: [facility];  Service: Orthopedics;  Laterality: Right;  100 mins/ to follow 1st case   Patient Active Problem List   Diagnosis Date Noted   OA (osteoarthritis) of hip 07/11/2018   Chronic cough 07/01/2013   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    PCP: Stevphen Rochester, MD  REFERRING PROVIDER: Stevphen Rochester, MD  REFERRING DIAG: R26.89 (ICD-10-CM) - Other abnormalities of gait and mobility M41.9 (ICD-10-CM) - Scoliosis, unspecified  Rationale for Evaluation and Treatment: Rehabilitation  THERAPY DIAG:  Other abnormalities of gait and mobility  Other low back pain  Abnormal posture  Muscle weakness (generalized)  ONSET DATE: December 01, 2022  SUBJECTIVE:                                                                                                                                                                                            SUBJECTIVE STATEMENT: Patient reports he is doing good today. He went to a pulmonologist and he is not worried about his cough, but he has been referred to get an MRI next week.  From Eval: Patient fell December 01, 2022 and he went to  the ED because his back was hurting. He fell walking to the bathroom at night and he did not have any lights one. He tripped over his rug. They did a CT scan at the ER and everything was fine. Since his fall he has been having a lot of coughing attacks and he feels that it has hurt his back. He feels his balance is horrible and his endurance is very poor. His back pain has improved and he want to focus more on his balance. He feels off balance when he is walking, walking and having to turn his body, and bending over.   PERTINENT HISTORY:  HTN; Hx prostate cancer; bilateral hip arthroplasty 2013, 2020;   PAIN: 02/09/2023 Are you having pain? Yes: NPRS scale: 0/10 Pain location: achy Pain description: Rt side of low back Aggravating factors: coughing Relieving factors: lidocaine patches  PRECAUTIONS: Fall  RED FLAGS: None   WEIGHT BEARING RESTRICTIONS: No  FALLS:  Has patient fallen in last 6 months? Yes. Number of falls 1. See above. PT to address balance deficits   LIVING ENVIRONMENT: Lives with: lives with their family Lives in: House/apartment Stairs: Yes: Internal: 12 steps; on right going up Has following equipment at home: Single point cane he uses the cane when he has to do a lot of walking  OCCUPATION: Retired  PLOF: Independent Leisure: watching baseball  PATIENT GOALS: To improve my balance  NEXT MD VISIT: 4 months  OBJECTIVE:  Note: Objective measures were completed at Evaluation unless otherwise noted.  DIAGNOSTIC FINDINGS:  IMPRESSION: CT thoracic spine 12/02/2022 1. No  evidence of acute thoracic spine fracture, traumatic subluxation or static signs of instability. 2. Old fractures of the left 11th rib posteriorly and right 7th rib laterally. 3. Chronic lung disease with progressive scarring and subpleural reticulation bilaterally. 4.  Aortic Atherosclerosis (ICD10-I70.0).  PATIENT SURVEYS:  ABC scale 40.62% low level of physical functioning  SCREENING FOR RED FLAGS: Bowel or bladder incontinence: No Spinal tumors: No Cauda equina syndrome: No Compression fracture: No Abdominal aneurysm: No  COGNITION: Overall cognitive status: Within functional limits for tasks assessed     SENSATION: WFL  MUSCLE LENGTH: Hamstrings: decreased bilateral   POSTURE: rounded shoulders and forward head  PALPATION: Increased muscle spasm of lumbar paraspinals   LUMBAR ROM: * pain  AROM eval  Flexion Bends to shins  Extension 30% limited  Right lateral flexion Bends above knee joint line *  Left lateral flexion Bends above knee joint line *  Right rotation 30 % limited *  Left rotation WFL   (Blank rows = not tested)  LOWER EXTREMITY ROM:  WFL   LOWER EXTREMITY MMT:    MMT Right eval Left eval  Hip flexion 4 4  Hip extension    Hip abduction 4 4  Hip adduction    Hip internal rotation    Hip external rotation    Knee flexion 4+ 4+  Knee extension 4+ 4+  Ankle dorsiflexion    Ankle plantarflexion    Ankle inversion    Ankle eversion     (Blank rows = not tested)    FUNCTIONAL TESTS:  5 times sit to stand: 14.59 sec no UE support Timed up and go (TUG): 13.52 sec 3 minute walk test: 447 ft ; increased fatigue after 1.5 minutes; 2 minute mark legs were tired 01/05/2023 SPPB:6 /12 Short Physical Performance Battery:    Balance: total 2       Side by side stance:  points (1)  Semi -tandem:     points (0)  Tandem:      points (1)    Gait Speed: (37m=9.84 feet) done 2x take the best time:    2  points                                                                     Repeated Chair Stands:   2   points   (timer stopped when straight on 5th stand)                                              Total score=    6  points <10/12 predictive of 1 or more mobility limitations and increased risk of mobility disability 6 or less/12 associated with a higher fall rate  DGI: 14/24  02/09/2023 5STS: 9.22 sec no UE support TUG:9.44sec  GAIT: Distance walked: 462ft Assistive device utilized: None Level of assistance: Complete Independence Comments: Wide BOS; patient caught Rt foot while walking once; symmetrical step length; decreased cadence  TODAY'S TREATMENT:     DATE:  02/09/2023: NuStep Level 3 8 mins- PT present to discuss status TUG: 9.22 sec 5 STS: 9.22 sec no UE support Leg Press seat 5 60# 2 x 10 bilateral  Narrow BOS on airex taping sticky note numbers on wall 3x 20sec narrow BOS & modified tandem Cone taps (2 forwards,1 lateral) x 8 each Finger tip hand hold Hurdles Forwards/ Sideways (reciprocal pattern) x 4 no UE support BOSU step taps/ lunge x 10 forwards & lateral no UE support Sit to Stand + Chest Press 4# DB 2 x 10 Unilateral farmer's carry 4# x 2 laps   01/31/2023: NuStep Level 3 8 mins- PT present to discuss status Leg Press seat 6 60# 2 x 10 bilateral  Modified tandem on airex 2 x 30 each Cone weaving X 3 Coe In & Outs x 2  Cone taps (2 forwards,1 lateral) x 8 each Finger tip hand hold 6inch step ups finger tip hand hold x 10 bilateral  Bil heel raises 2x10 Hurdles Forwards/ Sideways (reciprocal pattern) x 4 no UE support Sit to Stand + Chest Press 4# DB 2 x 10 Standing shoulder rows & extension with red TB 2 x 10   01/27/2023: NuStep Level 3 8 mins- PT present to discuss status Bil heel raises 10x Balance warm ups next to the railing: walk on toes, high step walk, backwards walk Standing on airex :  UE reaches and head turns, eyes closed, narrow base of support and staggered stance 5 Circles on  floor with toe taps 3 rounds of 5 Numbers on wall step and reach including cognitive component numbers coordinated to months of the year 5 rounds of 6   Resisted walk with 15# on cable pulley: backwards 5x, front walk outs 2 times (slow return to challenging)  PATIENT EDUCATION:  Education details: Cytogeneticist; POC; HEP Person educated: Patient Education method: Explanation, Demonstration, and Handouts Education comprehension: verbalized understanding, returned demonstration, and needs further education  HOME EXERCISE PROGRAM: Access Code: [facility] URL: [email or web address] Date: 02/09/2023 Prepared by: Claude Manges  Exercises - Supine Lower Trunk Rotation  - 1-2 x daily - 7 x weekly - 1 sets - 10 reps - 4-5 hold - Heel Raises with Counter Support  - 1-2 x daily - 7 x weekly - 1 sets - 10 reps - Sit to Stand  - 1-2 x daily - 7 x weekly - 1 sets - 5 reps - Seated Hamstring Stretch  - 1-2 x daily - 7 x weekly - 2 sets - 20-30 hold - Standing Shoulder Row with Anchored Resistance  - 1 x daily - 7 x weekly - 2 sets - 10 reps - Shoulder extension with resistance - Neutral  - 1 x daily - 7 x weekly - 2 sets - 10 reps - Step Up  - 1 x daily - 7 x weekly - 2 sets - 10 reps - Tandem Stance in Corner  - 1 x daily - 7 x weekly - 2 sets - 30 hold - Semi-Tandem Corner Balance With Eyes Open  - 1 x daily - 7 x weekly - 2 sets - 30 hold  ASSESSMENT:  CLINICAL IMPRESSION: Today's treatment session focused on balance and LE strengthening.  Haedyn verbalized feeling stronger since starting therapy but he still feels he needs to work on his balance. Reassess TUG and 5STS times and patient made significant improvements since eval. Patient met goals for those two functional tests. Updated HEP to include more balance and LE exercises.  Patient required verbal and visual cues for correct exercise performance. Patient will benefit from skilled PT to address the below impairments and improve overall function.     OBJECTIVE IMPAIRMENTS: Abnormal gait, decreased activity tolerance, decreased balance, decreased endurance, difficulty walking, decreased ROM, decreased strength, increased muscle spasms, impaired flexibility, prosthetic dependency , and pain.   ACTIVITY LIMITATIONS: bending, squatting, stairs, and transfers  PARTICIPATION LIMITATIONS: interpersonal relationship and community activity  PERSONAL FACTORS: Age and 1-2 comorbidities: HTN; Hx of cancer  are also affecting patient's functional outcome.   REHAB POTENTIAL: Good  CLINICAL DECISION MAKING: Stable/uncomplicated  EVALUATION COMPLEXITY: Low   GOALS: Goals reviewed with patient? Yes  SHORT TERM GOALS: Target date: 01/31/2023  Patient will be independent with initial HEP. Baseline:  Goal status: INITIAL  2.  Patient will report > or = to 30% improvement in balance & back pain since starting PT. Baseline:  Goal status: INITIAL  3 Patient will score < or = to 11 sec on 5 STS for improved functional mobility. Baseline: 14.59 sec Goal status: MET 02/09/2023   LONG TERM GOALS: Target date: 02/28/2023  Patient will demonstrate independence in advanced HEP. Baseline:  Goal status: INITIAL  2.  Patient will report > or = to 70% improvement in balance & back pain since starting PT. Baseline:  Goal status: INITIAL   3.  Patient will score < or = to 12 sec on TUG for decreased falls risk. Baseline: 13.52 sec Goal status: MET 02/09/2023  4.Patient will score > or = to 50% on ABC scale for improved level of physical functioning.  Baseline: 40% Goal status: INITIAL  5.  Patient will be able to ascend stairs in a reciprocal pattern with UE support. Baseline:  Goal status: INITIAL  6.  Patient will  ambulate > or = to 550 ft on for improved  community ambulation.  Baseline:  Goal status: INITIAL  PLAN:  PT FREQUENCY: 2x/week  PT DURATION: 8 weeks  PLANNED INTERVENTIONS: 97164- PT Re-evaluation, 97110-Therapeutic exercises, 97530- Therapeutic activity, 97112- Neuromuscular re-education, 97535- Self Care, 16109- Manual therapy, [id number]- Gait training, [phone]- Canalith repositioning, [id number]- Aquatic Therapy, 97014- Electrical stimulation (unattended), [id number]- Electrical stimulation (manual), [id number]- Vasopneumatic device, [id number]- Ultrasound, [id number]- Traction (mechanical), [id number]- Ionotophoresis 4mg /ml Dexamethasone, Patient/Family education, Balance training, Stair training, Taping, Dry Needling, Joint mobilization, Joint manipulation, Spinal manipulation, Spinal mobilization, Vestibular training, Cryotherapy, and Moist heat.  PLAN FOR NEXT SESSION: ; walking with head turns; step up with weight  Claude Manges, PT 02/09/23 1:21 PM

## 2023-02-14 ENCOUNTER — Encounter: Payer: Self-pay | Admitting: Physical Therapy

## 2023-02-14 ENCOUNTER — Ambulatory Visit: Payer: Medicare Other | Admitting: Physical Therapy

## 2023-02-14 DIAGNOSIS — M6281 Muscle weakness (generalized): Secondary | ICD-10-CM

## 2023-02-14 DIAGNOSIS — R2689 Other abnormalities of gait and mobility: Secondary | ICD-10-CM | POA: Diagnosis not present

## 2023-02-14 DIAGNOSIS — R293 Abnormal posture: Secondary | ICD-10-CM

## 2023-02-14 DIAGNOSIS — M5459 Other low back pain: Secondary | ICD-10-CM

## 2023-02-14 NOTE — Therapy (Signed)
OUTPATIENT PHYSICAL THERAPY THORACOLUMBAR & BALANCE TREATMENT   Patient Name: [name] MRN: [id number] DOB:06-May-1945, 78 y.o., male Today's Date: 02/14/2023  END OF SESSION:  PT End of Session - 02/14/23 1357     Visit Number 7    Date for PT Re-Evaluation 02/28/23    Authorization Type Medicare/ [employer]    Progress Note Due on Visit 10    PT Start Time 1225    PT Stop Time 1308    PT Time Calculation (min) 43 min    Activity Tolerance Patient tolerated treatment well    Behavior During Therapy [facility] for tasks assessed/performed                  Past Medical History:  Diagnosis Date   Cancer (HCC)    prostate   Erectile dysfunction    GERD without esophagitis    History of kidney stones    Hyperlipidemia    Hypertension    Irritable bowel    PAD (peripheral artery disease) (HCC)    Pneumonia    Rhinitis    Past Surgical History:  Procedure Laterality Date   both feet bunion surgery 2001     left hip unipolar hemiarthroplasty  1980   with revision 2013 with [name]   nasal surgery yrs ago     PROSTATECTOMY  2006   TOTAL HIP ARTHROPLASTY Right 07/11/2018   Procedure: TOTAL HIP ARTHROPLASTY ANTERIOR APPROACH;  Surgeon: Ollen Gross, MD;  Location: [facility];  Service: Orthopedics;  Laterality: Right;  100 mins/ to follow 1st case   Patient Active Problem List   Diagnosis Date Noted   OA (osteoarthritis) of hip 07/11/2018   Chronic cough 07/01/2013   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    PCP: Stevphen Rochester, MD  REFERRING PROVIDER: Stevphen Rochester, MD  REFERRING DIAG: R26.89 (ICD-10-CM) - Other abnormalities of gait and mobility M41.9 (ICD-10-CM) - Scoliosis, unspecified  Rationale for Evaluation and Treatment: Rehabilitation  THERAPY DIAG:  Other abnormalities of gait and mobility  Other low back pain  Abnormal posture  Muscle weakness (generalized)  ONSET DATE: December 01, 2022  SUBJECTIVE:                                                                                                                                                                                            SUBJECTIVE STATEMENT: Patient reports he is doing good today. His back was bothering him a little yesterday but he is doing good now.  From Eval: Patient fell December 01, 2022 and he went to the ED because his back was hurting. He fell  walking to the bathroom at night and he did not have any lights one. He tripped over his rug. They did a CT scan at the ER and everything was fine. Since his fall he has been having a lot of coughing attacks and he feels that it has hurt his back. He feels his balance is horrible and his endurance is very poor. His back pain has improved and he want to focus more on his balance. He feels off balance when he is walking, walking and having to turn his body, and bending over.   PERTINENT HISTORY:  HTN; Hx prostate cancer; bilateral hip arthroplasty 2013, 2020;   PAIN: 02/14/2023 Are you having pain? Yes: NPRS scale: 0/10 Pain location: achy Pain description: Rt side of low back Aggravating factors: coughing Relieving factors: lidocaine patches  PRECAUTIONS: Fall  RED FLAGS: None   WEIGHT BEARING RESTRICTIONS: No  FALLS:  Has patient fallen in last 6 months? Yes. Number of falls 1. See above. PT to address balance deficits   LIVING ENVIRONMENT: Lives with: lives with their family Lives in: House/apartment Stairs: Yes: Internal: 12 steps; on right going up Has following equipment at home: Single point cane he uses the cane when he has to do a lot of walking  OCCUPATION: Retired  PLOF: Independent Leisure: watching baseball  PATIENT GOALS: To improve my balance  NEXT MD VISIT: 4 months  OBJECTIVE:  Note: Objective measures were completed at Evaluation unless otherwise noted.  DIAGNOSTIC FINDINGS:  IMPRESSION: CT thoracic spine 12/02/2022 1. No evidence of acute thoracic spine fracture,  traumatic subluxation or static signs of instability. 2. Old fractures of the left 11th rib posteriorly and right 7th rib laterally. 3. Chronic lung disease with progressive scarring and subpleural reticulation bilaterally. 4.  Aortic Atherosclerosis (ICD10-I70.0).  PATIENT SURVEYS:  ABC scale 40.62% low level of physical functioning  SCREENING FOR RED FLAGS: Bowel or bladder incontinence: No Spinal tumors: No Cauda equina syndrome: No Compression fracture: No Abdominal aneurysm: No  COGNITION: Overall cognitive status: Within functional limits for tasks assessed     SENSATION: WFL  MUSCLE LENGTH: Hamstrings: decreased bilateral   POSTURE: rounded shoulders and forward head  PALPATION: Increased muscle spasm of lumbar paraspinals   LUMBAR ROM: * pain  AROM eval  Flexion Bends to shins  Extension 30% limited  Right lateral flexion Bends above knee joint line *  Left lateral flexion Bends above knee joint line *  Right rotation 30 % limited *  Left rotation WFL   (Blank rows = not tested)  LOWER EXTREMITY ROM:  WFL   LOWER EXTREMITY MMT:    MMT Right eval Left eval  Hip flexion 4 4  Hip extension    Hip abduction 4 4  Hip adduction    Hip internal rotation    Hip external rotation    Knee flexion 4+ 4+  Knee extension 4+ 4+  Ankle dorsiflexion    Ankle plantarflexion    Ankle inversion    Ankle eversion     (Blank rows = not tested)    FUNCTIONAL TESTS:  5 times sit to stand: 14.59 sec no UE support Timed up and go (TUG): 13.52 sec 3 minute walk test: 447 ft ; increased fatigue after 1.5 minutes; 2 minute mark legs were tired 01/05/2023 SPPB:6 /12 Short Physical Performance Battery:    Balance: total 2       Side by side stance:    points (1)  Semi -tandem:  points (0)  Tandem:      points (1)    Gait Speed: (63m=9.84 feet) done 2x take the best time:    2  points                                                                     Repeated Chair Stands:   2   points   (timer stopped when straight on 5th stand)                                              Total score=    6  points <10/12 predictive of 1 or more mobility limitations and increased risk of mobility disability 6 or less/12 associated with a higher fall rate  DGI: 14/24  02/09/2023 5STS: 9.22 sec no UE support TUG:9.44sec  GAIT: Distance walked: 434ft Assistive device utilized: None Level of assistance: Complete Independence Comments: Wide BOS; patient caught Rt foot while walking once; symmetrical step length; decreased cadence  TODAY'S TREATMENT:     DATE:  02/14/2023: NuStep Level 4 7 mins- PT present to discuss status Single leg on bosu + shoulder rows and extension with red TB 2 x 10 Bosu lunges x 10 each Hurdles Forwards/ Sideways (reciprocal pattern) x 4 no UE support Modified tandem on airex taping sticky note numbers on wall 2x 20-30sec  Leg Press seat 5 65# 2 x 10 bilateral ; single leg 30# x 10 each Walking with head turns (side to side x4; up & down x 2) Unilateral farmer's carry 4# x 2 laps Punches on airex 2# DB x 20 Unilateral farmer's carry 4# x 2 laps    02/09/2023: NuStep Level 3 8 mins- PT present to discuss status TUG: 9.22 sec 5 STS: 9.22 sec no UE support Leg Press seat 5 60# 2 x 10 bilateral  Narrow BOS on airex taping sticky note numbers on wall 3x 20sec narrow BOS & modified tandem Cone taps (2 forwards,1 lateral) x 8 each Finger tip hand hold Hurdles Forwards/ Sideways (reciprocal pattern) x 4 no UE support BOSU step taps/ lunge x 10 forwards & lateral no UE support Sit to Stand + Chest Press 4# DB 2 x 10 Unilateral farmer's carry 4# x 2 laps   01/31/2023: NuStep Level 3 8 mins- PT present to discuss status Leg Press seat 6 60# 2 x 10 bilateral  Modified tandem on airex 2 x 30 each Cone weaving X 3 Coe In & Outs x 2  Cone taps (2 forwards,1 lateral) x 8 each Finger tip hand hold 6inch step ups finger tip hand  hold x 10 bilateral  Bil heel raises 2x10 Hurdles Forwards/ Sideways (reciprocal pattern) x 4 no UE support Sit to Stand + Chest Press 4# DB 2 x 10 Standing shoulder rows & extension with red TB 2 x 10  PATIENT EDUCATION:  Education details: Cytogeneticist; POC; HEP Person educated: Patient Education method: Explanation, Demonstration, and Handouts Education comprehension: verbalized understanding, returned demonstration, and needs further education  HOME EXERCISE PROGRAM: Access Code: [facility] URL: [email or web address] Date: 02/09/2023 Prepared by: Claude Manges  Exercises - Supine Lower Trunk Rotation  - 1-2 x daily - 7 x weekly - 1 sets - 10 reps - 4-5 hold - Heel Raises with Counter Support  - 1-2 x daily - 7 x weekly - 1 sets - 10 reps - Sit to Stand  - 1-2 x daily - 7 x weekly - 1 sets - 5 reps - Seated Hamstring Stretch  - 1-2 x daily - 7 x weekly - 2 sets - 20-30 hold - Standing Shoulder Row with Anchored Resistance  - 1 x daily - 7 x weekly - 2 sets - 10 reps - Shoulder extension with resistance - Neutral  - 1 x daily - 7 x weekly - 2 sets - 10 reps - Step Up  - 1 x daily - 7 x weekly - 2 sets - 10 reps - Tandem Stance in Corner  - 1 x daily - 7 x weekly - 2 sets - 30 hold - Semi-Tandem Corner Balance With Eyes Open  - 1 x daily - 7 x weekly - 2 sets - 30 hold  ASSESSMENT:  CLINICAL IMPRESSION: Today's treatment session focused on balance and LE strengthening.  While performing walking with head turns patient had a lateral loss of balance and required UE support from PT and the use of stepping strategy to maintain balance. Left and right head turns were more challenging than looking up and down. Patient tolerated treatment session well. Single leg balance on an unstable surface was a good challenge for patient. Patient will benefit from skilled PT  to address the below impairments and improve overall function.      OBJECTIVE IMPAIRMENTS: Abnormal gait, decreased activity tolerance, decreased balance, decreased endurance, difficulty walking, decreased ROM, decreased strength, increased muscle spasms, impaired flexibility, prosthetic dependency , and pain.   ACTIVITY LIMITATIONS: bending, squatting, stairs, and transfers  PARTICIPATION LIMITATIONS: interpersonal relationship and community activity  PERSONAL FACTORS: Age and 1-2 comorbidities: HTN; Hx of cancer  are also affecting patient's functional outcome.   REHAB POTENTIAL: Good  CLINICAL DECISION MAKING: Stable/uncomplicated  EVALUATION COMPLEXITY: Low   GOALS: Goals reviewed with patient? Yes  SHORT TERM GOALS: Target date: 01/31/2023  Patient will be independent with initial HEP. Baseline:  Goal status: INITIAL  2.  Patient will report > or = to 30% improvement in balance & back pain since starting PT. Baseline:  Goal status: INITIAL  3 Patient will score < or = to 11 sec on 5 STS for improved functional mobility. Baseline: 14.59 sec Goal status: MET 02/09/2023   LONG TERM GOALS: Target date: 02/28/2023  Patient will demonstrate independence in advanced HEP. Baseline:  Goal status: INITIAL  2.  Patient will report > or = to 70% improvement in balance & back pain since starting PT. Baseline:  Goal status: INITIAL   3.  Patient will score < or = to 12 sec on TUG for decreased falls risk. Baseline: 13.52 sec Goal status: MET 02/09/2023  4.Patient will score > or = to 50% on ABC scale for improved level of physical functioning.  Baseline: 40% Goal status: INITIAL  5.  Patient will be able to ascend stairs in a reciprocal pattern with UE support. Baseline:  Goal status: INITIAL  6.  Patient will ambulate > or = to 550 ft on for improved community ambulation.  Baseline:  Goal status: INITIAL  PLAN:  PT FREQUENCY: 2x/week  PT DURATION: 8  weeks  PLANNED INTERVENTIONS: [id number]- PT Re-evaluation, 97110-Therapeutic exercises, 97530- Therapeutic activity, 97112- Neuromuscular re-education, 97535- Self Care, 86578- Manual therapy, [id number]- Gait training, [phone]- Canalith repositioning, [id number]- Aquatic Therapy, 97014- Electrical stimulation (unattended), [id number]- Electrical stimulation (manual), [id number]- Vasopneumatic device, [id number]- Ultrasound, [id number]- Traction (mechanical), [id number]- Ionotophoresis 4mg /ml Dexamethasone, Patient/Family education, Balance training, Stair training, Taping, Dry Needling, Joint mobilization, Joint manipulation, Spinal manipulation, Spinal mobilization, Vestibular training, Cryotherapy, and Moist heat.  PLAN FOR NEXT SESSION: ; step up with weight  Claude Manges, PT 02/14/23 1:59 PM

## 2023-02-16 ENCOUNTER — Encounter: Payer: Self-pay | Admitting: Physical Therapy

## 2023-02-16 ENCOUNTER — Ambulatory Visit: Payer: Medicare Other | Admitting: Physical Therapy

## 2023-02-16 DIAGNOSIS — M5459 Other low back pain: Secondary | ICD-10-CM

## 2023-02-16 DIAGNOSIS — M6281 Muscle weakness (generalized): Secondary | ICD-10-CM

## 2023-02-16 DIAGNOSIS — R2689 Other abnormalities of gait and mobility: Secondary | ICD-10-CM | POA: Diagnosis not present

## 2023-02-16 DIAGNOSIS — R293 Abnormal posture: Secondary | ICD-10-CM

## 2023-02-16 NOTE — Therapy (Addendum)
OUTPATIENT PHYSICAL THERAPY THORACOLUMBAR & BALANCE TREATMENT   Patient Name: [name] MRN: [id number] DOB:1945/02/25, 78 y.o., male Today's Date: 02/16/2023  END OF SESSION:  PT End of Session - 02/16/23 1316     Visit Number 8    Date for PT Re-Evaluation 02/28/23    Authorization Type Medicare/ [employer]    Progress Note Due on Visit 10    PT Start Time 1230    PT Stop Time 1314    PT Time Calculation (min) 44 min    Activity Tolerance Patient tolerated treatment well    Behavior During Therapy [facility] for tasks assessed/performed                   Past Medical History:  Diagnosis Date   Cancer [facility])    prostate   Erectile dysfunction    GERD without esophagitis    History of kidney stones    Hyperlipidemia    Hypertension    Irritable bowel    PAD (peripheral artery disease) (HCC)    Pneumonia    Rhinitis    Past Surgical History:  Procedure Laterality Date   both feet bunion surgery 2001     left hip unipolar hemiarthroplasty  1980   with revision 2013 with [name]   nasal surgery yrs ago     PROSTATECTOMY  2006   TOTAL HIP ARTHROPLASTY Right 07/11/2018   Procedure: TOTAL HIP ARTHROPLASTY ANTERIOR APPROACH;  Surgeon: Ollen Gross, MD;  Location: [facility];  Service: Orthopedics;  Laterality: Right;  100 mins/ to follow 1st case   Patient Active Problem List   Diagnosis Date Noted   OA (osteoarthritis) of hip 07/11/2018   Chronic cough 07/01/2013   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    PCP: Stevphen Rochester, MD  REFERRING PROVIDER: Stevphen Rochester, MD  REFERRING DIAG: R26.89 (ICD-10-CM) - Other abnormalities of gait and mobility M41.9 (ICD-10-CM) - Scoliosis, unspecified  Rationale for Evaluation and Treatment: Rehabilitation  THERAPY DIAG:  Other abnormalities of gait and mobility  Other low back pain  Abnormal posture  Muscle weakness (generalized)  ONSET DATE: December 01, 2022  SUBJECTIVE:                                                                                                                                                                                            SUBJECTIVE STATEMENT: Patient reports he feels achy in his Rt quad today.   From Eval: Patient fell December 01, 2022 and he went to the ED because his back was hurting. He fell walking to the bathroom at night and he did  not have any lights one. He tripped over his rug. They did a CT scan at the ER and everything was fine. Since his fall he has been having a lot of coughing attacks and he feels that it has hurt his back. He feels his balance is horrible and his endurance is very poor. His back pain has improved and he want to focus more on his balance. He feels off balance when he is walking, walking and having to turn his body, and bending over.   PERTINENT HISTORY:  HTN; Hx prostate cancer; bilateral hip arthroplasty 2013, 2020;   PAIN: 02/16/2023 Are you having pain? Yes: NPRS scale: 0/10 Pain location: achy Pain description: Rt side of low back Aggravating factors: coughing Relieving factors: lidocaine patches  PRECAUTIONS: Fall  RED FLAGS: None   WEIGHT BEARING RESTRICTIONS: No  FALLS:  Has patient fallen in last 6 months? Yes. Number of falls 1. See above. PT to address balance deficits   LIVING ENVIRONMENT: Lives with: lives with their family Lives in: House/apartment Stairs: Yes: Internal: 12 steps; on right going up Has following equipment at home: Single point cane he uses the cane when he has to do a lot of walking  OCCUPATION: Retired  PLOF: Independent Leisure: watching baseball  PATIENT GOALS: To improve my balance  NEXT MD VISIT: 4 months  OBJECTIVE:  Note: Objective measures were completed at Evaluation unless otherwise noted.  DIAGNOSTIC FINDINGS:  IMPRESSION: CT thoracic spine 12/02/2022 1. No evidence of acute thoracic spine fracture, traumatic subluxation or static signs of instability. 2.  Old fractures of the left 11th rib posteriorly and right 7th rib laterally. 3. Chronic lung disease with progressive scarring and subpleural reticulation bilaterally. 4.  Aortic Atherosclerosis (ICD10-I70.0).  PATIENT SURVEYS:  ABC scale 40.62% low level of physical functioning 02/16/2023 : 58% SCREENING FOR RED FLAGS: Bowel or bladder incontinence: No Spinal tumors: No Cauda equina syndrome: No Compression fracture: No Abdominal aneurysm: No  COGNITION: Overall cognitive status: Within functional limits for tasks assessed     SENSATION: WFL  MUSCLE LENGTH: Hamstrings: decreased bilateral   POSTURE: rounded shoulders and forward head  PALPATION: Increased muscle spasm of lumbar paraspinals   LUMBAR ROM: * pain  AROM eval  Flexion Bends to shins  Extension 30% limited  Right lateral flexion Bends above knee joint line *  Left lateral flexion Bends above knee joint line *  Right rotation 30 % limited *  Left rotation WFL   (Blank rows = not tested)  LOWER EXTREMITY ROM:  WFL   LOWER EXTREMITY MMT:    MMT Right eval Left eval  Hip flexion 4 4  Hip extension    Hip abduction 4 4  Hip adduction    Hip internal rotation    Hip external rotation    Knee flexion 4+ 4+  Knee extension 4+ 4+  Ankle dorsiflexion    Ankle plantarflexion    Ankle inversion    Ankle eversion     (Blank rows = not tested)    FUNCTIONAL TESTS:  5 times sit to stand: 14.59 sec no UE support Timed up and go (TUG): 13.52 sec 3 minute walk test: 447 ft ; increased fatigue after 1.5 minutes; 2 minute mark legs were tired 01/05/2023 SPPB:6 /12 Short Physical Performance Battery:    Balance: total 2       Side by side stance:    points (1)  Semi -tandem:     points (0)  Tandem:  points (1)    Gait Speed: (22m=9.84 feet) done 2x take the best time:    2  points                                                                    Repeated Chair Stands:   2   points   (timer  stopped when straight on 5th stand)                                              Total score=    6  points <10/12 predictive of 1 or more mobility limitations and increased risk of mobility disability 6 or less/12 associated with a higher fall rate  DGI: 14/24  02/09/2023 5STS: 9.22 sec no UE support TUG:9.44sec  02/19/2023 : 573 FT; legs were tired at the end  GAIT: Distance walked: 439ft Assistive device utilized: None Level of assistance: Complete Independence Comments: Wide BOS; patient caught Rt foot while walking once; symmetrical step length; decreased cadence  TODAY'S TREATMENT:     DATE:  02/16/2023: NuStep Level 4 7 mins- PT present to discuss status Lt quad stretch on chair 2 x 30 sec Hamstring stretch on step  x 30 sec bilateral  3 MWT: 573 FT; legs were tired at the end ABC Scale:58% Standing shoulder rows & extension with green TB modified tandem stance 2 x 10 Sit to Stand + Chest Press 4# DB 2 x 10 Punches on airex 2# DB x 20 Airex step ups hold 2# DB x 10 each leg Modified tandem on airex taping sticky note numbers on wall 2x 20-30sec  Leg Press seat 5 65# 2 x 10 bilateral ; single leg 30# x 10 each     02/14/2023: NuStep Level 4 7 mins- PT present to discuss status Single leg on bosu + shoulder rows and extension with red TB 2 x 10 Bosu lunges x 10 each Hurdles Forwards/ Sideways (reciprocal pattern) x 4 no UE support Modified tandem on airex taping sticky note numbers on wall 2x 20-30sec  Leg Press seat 5 65# 2 x 10 bilateral ; single leg 30# x 10 each Walking with head turns (side to side x4; up & down x 2) Unilateral farmer's carry 4# x 2 laps Punches on airex 2# DB x 20 Unilateral farmer's carry 4# x 2 laps    02/09/2023: NuStep Level 3 8 mins- PT present to discuss status TUG: 9.22 sec 5 STS: 9.22 sec no UE support Leg Press seat 5 60# 2 x 10 bilateral  Narrow BOS on airex taping sticky note numbers on wall 3x 20sec narrow BOS & modified  tandem Cone taps (2 forwards,1 lateral) x 8 each Finger tip hand hold Hurdles Forwards/ Sideways (reciprocal pattern) x 4 no UE support BOSU step taps/ lunge x 10 forwards & lateral no UE support Sit to Stand + Chest Press 4# DB 2 x 10 Unilateral farmer's carry 4# x 2 laps   01/31/2023: NuStep Level 3 8 mins- PT present to discuss status Leg Press seat 6 60# 2 x 10 bilateral  Modified tandem on airex 2 x 30 each  Cone weaving X 3 Coe In & Outs x 2  Cone taps (2 forwards,1 lateral) x 8 each Finger tip hand hold 6inch step ups finger tip hand hold x 10 bilateral  Bil heel raises 2x10 Hurdles Forwards/ Sideways (reciprocal pattern) x 4 no UE support Sit to Stand + Chest Press 4# DB 2 x 10 Standing shoulder rows & extension with red TB 2 x 10                                                                                                                    PATIENT EDUCATION:  Education details: Balance systems; POC; HEP Person educated: Patient Education method: Explanation, Demonstration, and Handouts Education comprehension: verbalized understanding, returned demonstration, and needs further education  HOME EXERCISE PROGRAM: Access Code: [facility] URL: [email or web address] Date: 02/09/2023 Prepared by: Claude Manges  Exercises - Supine Lower Trunk Rotation  - 1-2 x daily - 7 x weekly - 1 sets - 10 reps - 4-5 hold - Heel Raises with Counter Support  - 1-2 x daily - 7 x weekly - 1 sets - 10 reps - Sit to Stand  - 1-2 x daily - 7 x weekly - 1 sets - 5 reps - Seated Hamstring Stretch  - 1-2 x daily - 7 x weekly - 2 sets - 20-30 hold - Standing Shoulder Row with Anchored Resistance  - 1 x daily - 7 x weekly - 2 sets - 10 reps - Shoulder extension with resistance - Neutral  - 1 x daily - 7 x weekly - 2 sets - 10 reps - Step Up  - 1 x daily - 7 x weekly - 2 sets - 10 reps - Tandem Stance in Corner  - 1 x daily - 7 x weekly - 2 sets - 30 hold - Semi-Tandem Corner Balance  With Eyes Open  - 1 x daily - 7 x weekly - 2 sets - 30 hold  ASSESSMENT:  CLINICAL IMPRESSION: Today's treatment session focused on balance and LE strengthening.  Re-  administered and Raquon ambulated 126 feet further than evaluation. He verbalized increased leg pain at the end of the test, but he tolerated the walking time better than eval. He verbalized feeling more balanced and less wobbly. Patient tolerated treatment session well, no LOB noted. He verbalized feeling 50% better and more confident in his balance since starting physical therapy. Patient will benefit from skilled PT to address the below impairments and improve overall function.       OBJECTIVE IMPAIRMENTS: Abnormal gait, decreased activity tolerance, decreased balance, decreased endurance, difficulty walking, decreased ROM, decreased strength, increased muscle spasms, impaired flexibility, prosthetic dependency , and pain.   ACTIVITY LIMITATIONS: bending, squatting, stairs, and transfers  PARTICIPATION LIMITATIONS: interpersonal relationship and community activity  PERSONAL FACTORS: Age and 1-2 comorbidities: HTN; Hx of cancer  are also affecting patient's functional outcome.   REHAB POTENTIAL: Good  CLINICAL DECISION MAKING: Stable/uncomplicated  EVALUATION COMPLEXITY: Low   GOALS: Goals reviewed with patient? Yes  SHORT TERM GOALS: Target date: 01/31/2023  Patient will be independent with initial HEP. Baseline:  Goal status: MET 02/16/2023  2.  Patient will report > or = to 30% improvement in balance & back pain since starting PT. Baseline:  Goal status: MET 02/16/2023  3 Patient will score < or = to 11 sec on 5 STS for improved functional mobility. Baseline: 14.59 sec Goal status: MET 02/09/2023   LONG TERM GOALS: Target date: 02/28/2023  Patient will demonstrate independence in advanced HEP. Baseline:  Goal status: INITIAL  2.  Patient will report > or = to 70% improvement in balance & back pain  since starting PT. Baseline:  Goal status: IN PROGRESS 02/16/2023   3.  Patient will score < or = to 12 sec on TUG for decreased falls risk. Baseline: 13.52 sec Goal status: MET 02/09/2023  4.Patient will score > or = to 50% on ABC scale for improved level of physical functioning.  Baseline: 40% Goal status: MET 02/16/2023  5.  Patient will be able to ascend stairs in a reciprocal pattern with UE support. Baseline:  Goal status: INITIAL  6.  Patient will ambulate > or = to 550 ft on for improved community ambulation.  Baseline:  Goal status: MET 02/16/2023  PLAN:  PT FREQUENCY: 2x/week  PT DURATION: 8 weeks  PLANNED INTERVENTIONS: [id number]- PT Re-evaluation, 97110-Therapeutic exercises, 97530- Therapeutic activity, 97112- Neuromuscular re-education, 97535- Self Care, 16109- Manual therapy, [id number]- Gait training, [phone]- Canalith repositioning, [id number]- Aquatic Therapy, 97014- Electrical stimulation (unattended), [id number]- Electrical stimulation (manual), [id number]- Vasopneumatic device, [id number]- Ultrasound, [id number]- Traction (mechanical), [id number]- Ionotophoresis 4mg /ml Dexamethasone, Patient/Family education, Balance training, Stair training, Taping, Dry Needling, Joint mobilization, Joint manipulation, Spinal manipulation, Spinal mobilization, Vestibular training, Cryotherapy, and Moist heat.  PLAN FOR NEXT SESSION: single leg balance; airex balance  Claude Manges, PT 02/16/23 1:17 PM

## 2023-02-21 ENCOUNTER — Ambulatory Visit: Payer: Medicare Other | Admitting: Physical Therapy

## 2023-02-21 DIAGNOSIS — M6281 Muscle weakness (generalized): Secondary | ICD-10-CM

## 2023-02-21 DIAGNOSIS — M5459 Other low back pain: Secondary | ICD-10-CM

## 2023-02-21 DIAGNOSIS — R2689 Other abnormalities of gait and mobility: Secondary | ICD-10-CM | POA: Diagnosis not present

## 2023-02-21 DIAGNOSIS — R293 Abnormal posture: Secondary | ICD-10-CM

## 2023-02-21 NOTE — Therapy (Signed)
OUTPATIENT PHYSICAL THERAPY THORACOLUMBAR & BALANCE TREATMENT   Patient Name: [name] MRN: [id number] DOB:[birth year], 78 y.o., male Today's Date: 02/21/2023  END OF SESSION:  PT End of Session - 02/21/23 1313     Visit Number 9    Date for PT Re-Evaluation 02/28/23    Authorization Type Medicare/ [employer]    Progress Note Due on Visit 10    PT Start Time 1231    PT Stop Time 1312    PT Time Calculation (min) 41 min    Activity Tolerance Patient tolerated treatment well    Behavior During Therapy [facility] for tasks assessed/performed                    Past Medical History:  Diagnosis Date   Cancer [facility])    prostate   Erectile dysfunction    GERD without esophagitis    History of kidney stones    Hyperlipidemia    Hypertension    Irritable bowel    PAD (peripheral artery disease) (HCC)    Pneumonia    Rhinitis    Past Surgical History:  Procedure Laterality Date   both feet bunion surgery 2001     left hip unipolar hemiarthroplasty  1980   with revision 2013 with [name]   nasal surgery yrs ago     PROSTATECTOMY  2006   TOTAL HIP ARTHROPLASTY Right 07/11/2018   Procedure: TOTAL HIP ARTHROPLASTY ANTERIOR APPROACH;  Surgeon: Ollen Gross, MD;  Location: [facility] ORS;  Service: Orthopedics;  Laterality: Right;  100 mins/ to follow 1st case   Patient Active Problem List   Diagnosis Date Noted   OA (osteoarthritis) of hip 07/11/2018   Chronic cough 07/01/2013   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    PCP: Stevphen Rochester, MD  REFERRING PROVIDER: Stevphen Rochester, MD  REFERRING DIAG: R26.89 (ICD-10-CM) - Other abnormalities of gait and mobility M41.9 (ICD-10-CM) - Scoliosis, unspecified  Rationale for Evaluation and Treatment: Rehabilitation  THERAPY DIAG:  Other abnormalities of gait and mobility  Other low back pain  Abnormal posture  Muscle weakness (generalized)  ONSET DATE: December 01, 2022  SUBJECTIVE:                                                                                                                                                                                            SUBJECTIVE STATEMENT: Patient reports he is doing good today. No new complaints.  From Eval: Patient fell December 01, 2022 and he went to the ED because his back was hurting. He fell walking to the bathroom at night and he did  not have any lights one. He tripped over his rug. They did a CT scan at the ER and everything was fine. Since his fall he has been having a lot of coughing attacks and he feels that it has hurt his back. He feels his balance is horrible and his endurance is very poor. His back pain has improved and he want to focus more on his balance. He feels off balance when he is walking, walking and having to turn his body, and bending over.   PERTINENT HISTORY:  HTN; Hx prostate cancer; bilateral hip arthroplasty 2013, 2020;   PAIN: 02/21/2023 Are you having pain? Yes: NPRS scale: 0/10 Pain location: achy Pain description: Rt side of low back Aggravating factors: coughing Relieving factors: lidocaine patches  PRECAUTIONS: Fall  RED FLAGS: None   WEIGHT BEARING RESTRICTIONS: No  FALLS:  Has patient fallen in last 6 months? Yes. Number of falls 1. See above. PT to address balance deficits   LIVING ENVIRONMENT: Lives with: lives with their family Lives in: House/apartment Stairs: Yes: Internal: 12 steps; on right going up Has following equipment at home: Single point cane he uses the cane when he has to do a lot of walking  OCCUPATION: Retired  PLOF: Independent Leisure: watching baseball  PATIENT GOALS: To improve my balance  NEXT MD VISIT: 4 months  OBJECTIVE:  Note: Objective measures were completed at Evaluation unless otherwise noted.  DIAGNOSTIC FINDINGS:  IMPRESSION: CT thoracic spine 12/02/2022 1. No evidence of acute thoracic spine fracture, traumatic subluxation or static signs of  instability. 2. Old fractures of the left 11th rib posteriorly and right 7th rib laterally. 3. Chronic lung disease with progressive scarring and subpleural reticulation bilaterally. 4.  Aortic Atherosclerosis (ICD10-I70.0).  PATIENT SURVEYS:  ABC scale 40.62% low level of physical functioning 02/16/2023 : 58% SCREENING FOR RED FLAGS: Bowel or bladder incontinence: No Spinal tumors: No Cauda equina syndrome: No Compression fracture: No Abdominal aneurysm: No  COGNITION: Overall cognitive status: Within functional limits for tasks assessed     SENSATION: WFL  MUSCLE LENGTH: Hamstrings: decreased bilateral   POSTURE: rounded shoulders and forward head  PALPATION: Increased muscle spasm of lumbar paraspinals   LUMBAR ROM: * pain  AROM eval  Flexion Bends to shins  Extension 30% limited  Right lateral flexion Bends above knee joint line *  Left lateral flexion Bends above knee joint line *  Right rotation 30 % limited *  Left rotation WFL   (Blank rows = not tested)  LOWER EXTREMITY ROM:  WFL   LOWER EXTREMITY MMT:    MMT Right eval Left eval  Hip flexion 4 4  Hip extension    Hip abduction 4 4  Hip adduction    Hip internal rotation    Hip external rotation    Knee flexion 4+ 4+  Knee extension 4+ 4+  Ankle dorsiflexion    Ankle plantarflexion    Ankle inversion    Ankle eversion     (Blank rows = not tested)    FUNCTIONAL TESTS:  5 times sit to stand: 14.59 sec no UE support Timed up and go (TUG): 13.52 sec 3 minute walk test: 447 ft ; increased fatigue after 1.5 minutes; 2 minute mark legs were tired 01/05/2023 SPPB:6 /12 Short Physical Performance Battery:    Balance: total 2       Side by side stance:    points (1)  Semi -tandem:     points (0)  Tandem:  points (1)    Gait Speed: (56m=9.84 feet) done 2x take the best time:    2  points                                                                    Repeated Chair Stands:   2    points   (timer stopped when straight on 5th stand)                                              Total score=    6  points <10/12 predictive of 1 or more mobility limitations and increased risk of mobility disability 6 or less/12 associated with a higher fall rate  DGI: 14/24  02/09/2023 5STS: 9.22 sec no UE support TUG:9.44sec  02/19/2023 : 573 FT; legs were tired at the end  GAIT: Distance walked: 458ft Assistive device utilized: None Level of assistance: Complete Independence Comments: Wide BOS; patient caught Rt foot while walking once; symmetrical step length; decreased cadence  TODAY'S TREATMENT:     DATE:  02/21/2023: NuStep Level 4 7 mins- PT present to discuss status Standing shoulder rows & extension with green TB modified tandem stance 2 x 10 Sit to Stand + Chest Press 4# DB 2 x 10 Diagonal (ear to hip) on airex 2# DB x 10 each direction Airex step ups hold 4# DB x 10 each leg Resisted walking 5# backwards; 5# sideways x 4 each directon Leg Press seat 5 70# 2 x 10 bilateral ; single leg 35# x 10 each Seated chest press 4# 2 x8  Seated trunk extension with long magenta loop 2 x 10  Farmer's carry 4# x 2 laps   02/16/2023: NuStep Level 4 7 mins- PT present to discuss status Lt quad stretch on chair 2 x 30 sec Hamstring stretch on step  x 30 sec bilateral  3 MWT: 573 FT; legs were tired at the end ABC Scale:58% Standing shoulder rows & extension with green TB modified tandem stance 2 x 10 Sit to Stand + Chest Press 4# DB 2 x 10 Punches on airex 2# DB x 20 Airex step ups hold 2# DB x 10 each leg Modified tandem on airex taping sticky note numbers on wall 2x 20-30sec  Leg Press seat 5 65# 2 x 10 bilateral ; single leg 30# x 10 each    02/14/2023: NuStep Level 4 7 mins- PT present to discuss status Single leg on bosu + shoulder rows and extension with red TB 2 x 10 Bosu lunges x 10 each Hurdles Forwards/ Sideways (reciprocal pattern) x 4 no UE  support Modified tandem on airex taping sticky note numbers on wall 2x 20-30sec  Leg Press seat 5 65# 2 x 10 bilateral ; single leg 30# x 10 each Walking with head turns (side to side x4; up & down x 2) Unilateral farmer's carry 4# x 2 laps Punches on airex 2# DB x 20 Unilateral farmer's carry 4# x 2 laps  PATIENT EDUCATION:  Education details: Cytogeneticist; POC; HEP Person educated: Patient Education method: Explanation, Demonstration, and Handouts Education comprehension: verbalized understanding, returned demonstration, and needs further education  HOME EXERCISE PROGRAM: Access Code: [facility] URL: [email or web address] Date: 02/09/2023 Prepared by: Claude Manges  Exercises - Supine Lower Trunk Rotation  - 1-2 x daily - 7 x weekly - 1 sets - 10 reps - 4-5 hold - Heel Raises with Counter Support  - 1-2 x daily - 7 x weekly - 1 sets - 10 reps - Sit to Stand  - 1-2 x daily - 7 x weekly - 1 sets - 5 reps - Seated Hamstring Stretch  - 1-2 x daily - 7 x weekly - 2 sets - 20-30 hold - Standing Shoulder Row with Anchored Resistance  - 1 x daily - 7 x weekly - 2 sets - 10 reps - Shoulder extension with resistance - Neutral  - 1 x daily - 7 x weekly - 2 sets - 10 reps - Step Up  - 1 x daily - 7 x weekly - 2 sets - 10 reps - Tandem Stance in Corner  - 1 x daily - 7 x weekly - 2 sets - 30 hold - Semi-Tandem Corner Balance With Eyes Open  - 1 x daily - 7 x weekly - 2 sets - 30 hold  ASSESSMENT:  CLINICAL IMPRESSION: Today's treatment session focused on balance and LE strengthening. Incorporated resisted walking and side stepping with the Lt LE leading was more challenging for patient. Patient required the use of stepping strategy to maintain balance. Overall, patient tolerated treatment session well. Patient required verbal and visual cues for correct exercise  performance. Patient will benefit from skilled PT to address the below impairments and improve overall function.        OBJECTIVE IMPAIRMENTS: Abnormal gait, decreased activity tolerance, decreased balance, decreased endurance, difficulty walking, decreased ROM, decreased strength, increased muscle spasms, impaired flexibility, prosthetic dependency , and pain.   ACTIVITY LIMITATIONS: bending, squatting, stairs, and transfers  PARTICIPATION LIMITATIONS: interpersonal relationship and community activity  PERSONAL FACTORS: Age and 1-2 comorbidities: HTN; Hx of cancer  are also affecting patient's functional outcome.   REHAB POTENTIAL: Good  CLINICAL DECISION MAKING: Stable/uncomplicated  EVALUATION COMPLEXITY: Low   GOALS: Goals reviewed with patient? Yes  SHORT TERM GOALS: Target date: 01/31/2023  Patient will be independent with initial HEP. Baseline:  Goal status: MET 02/16/2023  2.  Patient will report > or = to 30% improvement in balance & back pain since starting PT. Baseline:  Goal status: MET 02/16/2023  3 Patient will score < or = to 11 sec on 5 STS for improved functional mobility. Baseline: 14.59 sec Goal status: MET 02/09/2023   LONG TERM GOALS: Target date: 02/28/2023  Patient will demonstrate independence in advanced HEP. Baseline:  Goal status: INITIAL  2.  Patient will report > or = to 70% improvement in balance & back pain since starting PT. Baseline:  Goal status: IN PROGRESS 02/16/2023   3.  Patient will score < or = to 12 sec on TUG for decreased falls risk. Baseline: 13.52 sec Goal status: MET 02/09/2023  4.Patient will score > or = to 50% on ABC scale for improved level of physical functioning.  Baseline: 40% Goal status: MET 02/16/2023  5.  Patient will be able to ascend stairs in a reciprocal pattern with UE support. Baseline:  Goal status: INITIAL  6.  Patient will ambulate > or = to 550 ft on  for improved community ambulation.   Baseline:  Goal status: MET 02/16/2023  PLAN:  PT FREQUENCY: 2x/week  PT DURATION: 8 weeks  PLANNED INTERVENTIONS: [id number]- PT Re-evaluation, 97110-Therapeutic exercises, 97530- Therapeutic activity, 97112- Neuromuscular re-education, 97535- Self Care, 95638- Manual therapy, [id number]- Gait training, [phone]- Canalith repositioning, [id number]- Aquatic Therapy, 97014- Electrical stimulation (unattended), [id number]- Electrical stimulation (manual), [id number]- Vasopneumatic device, [id number]- Ultrasound, [id number]- Traction (mechanical), [id number]- Ionotophoresis 4mg /ml Dexamethasone, Patient/Family education, Balance training, Stair training, Taping, Dry Needling, Joint mobilization, Joint manipulation, Spinal manipulation, Spinal mobilization, Vestibular training, Cryotherapy, and Moist heat.  PLAN FOR NEXT SESSION: 10th visit PN; single leg balance on airex; hip series with AW  Claude Manges, PT 02/21/23 1:14 PM

## 2023-02-23 ENCOUNTER — Ambulatory Visit: Payer: Medicare Other | Admitting: Physical Therapy

## 2023-02-23 ENCOUNTER — Encounter: Payer: Self-pay | Admitting: Physical Therapy

## 2023-02-23 DIAGNOSIS — R2689 Other abnormalities of gait and mobility: Secondary | ICD-10-CM

## 2023-02-23 DIAGNOSIS — M5459 Other low back pain: Secondary | ICD-10-CM

## 2023-02-23 DIAGNOSIS — M6281 Muscle weakness (generalized): Secondary | ICD-10-CM

## 2023-02-23 DIAGNOSIS — R293 Abnormal posture: Secondary | ICD-10-CM

## 2023-02-23 NOTE — Therapy (Signed)
OUTPATIENT PHYSICAL THERAPY THORACOLUMBAR & BALANCE TREATMENT   Patient Name: [name] MRN: [id number] DOB:1945/11/19, 78 y.o., male Today's Date: 02/23/2023  Progress Note Reporting Period 01/03/2024 to 02/23/2023  See note below for Objective Data and Assessment of Progress/Goals.     END OF SESSION:  PT End of Session - 02/23/23 1315     Visit Number 10    Date for PT Re-Evaluation 02/28/23    Authorization Type Medicare/ [employer]    Progress Note Due on Visit 10    PT Start Time 1231    PT Stop Time 1311    PT Time Calculation (min) 40 min    Activity Tolerance Patient tolerated treatment well    Behavior During Therapy [facility] for tasks assessed/performed                     Past Medical History:  Diagnosis Date   Cancer [facility])    prostate   Erectile dysfunction    GERD without esophagitis    History of kidney stones    Hyperlipidemia    Hypertension    Irritable bowel    PAD (peripheral artery disease) (HCC)    Pneumonia    Rhinitis    Past Surgical History:  Procedure Laterality Date   both feet bunion surgery 2001     left hip unipolar hemiarthroplasty  1980   with revision 2013 with [name]   nasal surgery yrs ago     PROSTATECTOMY  2006   TOTAL HIP ARTHROPLASTY Right 07/11/2018   Procedure: TOTAL HIP ARTHROPLASTY ANTERIOR APPROACH;  Surgeon: Ollen Gross, MD;  Location: [facility];  Service: Orthopedics;  Laterality: Right;  100 mins/ to follow 1st case   Patient Active Problem List   Diagnosis Date Noted   OA (osteoarthritis) of hip 07/11/2018   Chronic cough 07/01/2013   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    PCP: Stevphen Rochester, MD  REFERRING PROVIDER: Stevphen Rochester, MD  REFERRING DIAG: R26.89 (ICD-10-CM) - Other abnormalities of gait and mobility M41.9 (ICD-10-CM) - Scoliosis, unspecified  Rationale for Evaluation and Treatment: Rehabilitation  THERAPY DIAG:  Other abnormalities of gait and  mobility  Other low back pain  Abnormal posture  Muscle weakness (generalized)  ONSET DATE: December 01, 2022  SUBJECTIVE:                                                                                                                                                                                           SUBJECTIVE STATEMENT: Patient reports he is doing good today. No new complaints.  From Eval: Patient fell December 01, 2022 and he went to the ED because his back was hurting. He fell walking to the bathroom at night and he did not have any lights one. He tripped over his rug. They did a CT scan at the ER and everything was fine. Since his fall he has been having a lot of coughing attacks and he feels that it has hurt his back. He feels his balance is horrible and his endurance is very poor. His back pain has improved and he want to focus more on his balance. He feels off balance when he is walking, walking and having to turn his body, and bending over.   PERTINENT HISTORY:  HTN; Hx prostate cancer; bilateral hip arthroplasty 2013, 2020;   PAIN: 02/21/2023 Are you having pain? Yes: NPRS scale: 0/10 Pain location: achy Pain description: Rt side of low back Aggravating factors: coughing Relieving factors: lidocaine patches  PRECAUTIONS: Fall  RED FLAGS: None   WEIGHT BEARING RESTRICTIONS: No  FALLS:  Has patient fallen in last 6 months? Yes. Number of falls 1. See above. PT to address balance deficits   LIVING ENVIRONMENT: Lives with: lives with their family Lives in: House/apartment Stairs: Yes: Internal: 12 steps; on right going up Has following equipment at home: Single point cane he uses the cane when he has to do a lot of walking  OCCUPATION: Retired  PLOF: Independent Leisure: watching baseball  PATIENT GOALS: To improve my balance  NEXT MD VISIT: 4 months  OBJECTIVE:  Note: Objective measures were completed at Evaluation unless otherwise noted.  DIAGNOSTIC  FINDINGS:  IMPRESSION: CT thoracic spine 12/02/2022 1. No evidence of acute thoracic spine fracture, traumatic subluxation or static signs of instability. 2. Old fractures of the left 11th rib posteriorly and right 7th rib laterally. 3. Chronic lung disease with progressive scarring and subpleural reticulation bilaterally. 4.  Aortic Atherosclerosis (ICD10-I70.0).  PATIENT SURVEYS:  ABC scale 40.62% low level of physical functioning 02/16/2023 : 58% SCREENING FOR RED FLAGS: Bowel or bladder incontinence: No Spinal tumors: No Cauda equina syndrome: No Compression fracture: No Abdominal aneurysm: No  COGNITION: Overall cognitive status: Within functional limits for tasks assessed     SENSATION: WFL  MUSCLE LENGTH: Hamstrings: decreased bilateral   POSTURE: rounded shoulders and forward head  PALPATION: Increased muscle spasm of lumbar paraspinals   LUMBAR ROM: * pain  AROM eval  Flexion Bends to shins  Extension 30% limited  Right lateral flexion Bends above knee joint line *  Left lateral flexion Bends above knee joint line *  Right rotation 30 % limited *  Left rotation WFL   (Blank rows = not tested)  LOWER EXTREMITY ROM:  WFL   LOWER EXTREMITY MMT:    MMT Right eval Left eval  Hip flexion 4 4  Hip extension    Hip abduction 4 4  Hip adduction    Hip internal rotation    Hip external rotation    Knee flexion 4+ 4+  Knee extension 4+ 4+  Ankle dorsiflexion    Ankle plantarflexion    Ankle inversion    Ankle eversion     (Blank rows = not tested)    FUNCTIONAL TESTS:  5 times sit to stand: 14.59 sec no UE support Timed up and go (TUG): 13.52 sec 3 minute walk test: 447 ft ; increased fatigue after 1.5 minutes; 2 minute mark legs were tired 01/05/2023 SPPB:6 /12 Short Physical Performance Battery:    Balance: total 2  Side by side stance:    points (1)  Semi -tandem:     points (0)  Tandem:      points (1)    Gait Speed: (59m=9.84 feet)  done 2x take the best time:    2  points                                                                    Repeated Chair Stands:   2   points   (timer stopped when straight on 5th stand)                                              Total score=    6  points <10/12 predictive of 1 or more mobility limitations and increased risk of mobility disability 6 or less/12 associated with a higher fall rate  DGI: 14/24  02/09/2023 5STS: 9.22 sec no UE support TUG:9.44sec  02/19/2023 : 573 FT; legs were tired at the end   GAIT: Distance walked: 493ft Assistive device utilized: None Level of assistance: Complete Independence Comments: Wide BOS; patient caught Rt foot while walking once; symmetrical step length; decreased cadence  TODAY'S TREATMENT:     DATE:  02/23/2023: NuStep Level 4 7 mins- PT present to discuss status Discussion of ending POC & updating HEP Sit to Stand + Chest Press 5# DB 2 x 10 Diagonal (ear to hip) on airex 2# DB x 10 each direction Airex step ups hold 5# DB x 10 each leg Lateral airex step up x 10 each direction Shoulder press 5# x 10 bilateral  Leg Press seat 5 65# 2 x 10 bilateral ; single leg 40# 2 x 10 each   02/21/2023: NuStep Level 4 7 mins- PT present to discuss status Standing shoulder rows & extension with green TB modified tandem stance 2 x 10 Sit to Stand + Chest Press 4# DB 2 x 10 Diagonal (ear to hip) on airex 2# DB x 10 each direction Airex step ups hold 4# DB x 10 each leg Resisted walking 5# backwards; 5# sideways x 4 each directon Leg Press seat 5 70# 2 x 10 bilateral ; single leg 35# x 10 each Seated chest press 4# 2 x8  Seated trunk extension with long magenta loop 2 x 10  Farmer's carry 4# x 2 laps   02/16/2023: NuStep Level 4 7 mins- PT present to discuss status Lt quad stretch on chair 2 x 30 sec Hamstring stretch on step  x 30 sec bilateral  3 MWT: 573 FT; legs were tired at the end ABC Scale:58% Standing shoulder rows &  extension with green TB modified tandem stance 2 x 10 Sit to Stand + Chest Press 4# DB 2 x 10 Punches on airex 2# DB x 20 Airex step ups hold 2# DB x 10 each leg Modified tandem on airex taping sticky note numbers on wall 2x 20-30sec  Leg Press seat 5 65# 2 x 10 bilateral ; single leg 30# x 10 each  PATIENT EDUCATION:  Education details: Cytogeneticist; POC; HEP Person educated: Patient Education method: Explanation, Demonstration, and Handouts Education comprehension: verbalized understanding, returned demonstration, and needs further education  HOME EXERCISE PROGRAM: Access Code: [facility] URL: [email or web address] Date: 02/23/2023 Prepared by: Claude Manges  Exercises - Supine Lower Trunk Rotation  - 1-2 x daily - 7 x weekly - 1 sets - 10 reps - 4-5 hold - Heel Raises with Counter Support  - 1-2 x daily - 7 x weekly - 1 sets - 10 reps - Sit to Stand  - 1-2 x daily - 7 x weekly - 1 sets - 5 reps - Seated Hamstring Stretch  - 1-2 x daily - 7 x weekly - 2 sets - 20-30 hold - Standing Shoulder Row with Anchored Resistance  - 1 x daily - 7 x weekly - 2 sets - 10 reps - Shoulder extension with resistance - Neutral  - 1 x daily - 7 x weekly - 2 sets - 10 reps - Step Up  - 1 x daily - 7 x weekly - 2 sets - 10 reps - Tandem Stance in Corner  - 1 x daily - 7 x weekly - 2 sets - 30 hold - Semi-Tandem Corner Balance With Eyes Open  - 1 x daily - 7 x weekly - 2 sets - 30 hold - Standing Bicep Curls Supinated with Dumbbells  - 1 x daily - 7 x weekly - 3 sets - 10 reps - Sit to Stand with Weighted Bag  - 1 x daily - 7 x weekly - 2 sets - 10 reps - Standing Balance with Diagonal Ball Lift  - 1 x daily - 7 x weekly - 1 sets - 10 reps - Shoulder Overhead Press in Flexion with Dumbbells  - 1 x daily - 7 x weekly - 2 sets - 10 reps  ASSESSMENT:  CLINICAL  IMPRESSION: Mahlik is progressing appropriately with physical therapy. He is on track to meet goals by end of POC. Updated HEP to include UE and balance progressions. Patient required verbal and visual cues for correct exercise performance. Plan to discharge next treatment session,      OBJECTIVE IMPAIRMENTS: Abnormal gait, decreased activity tolerance, decreased balance, decreased endurance, difficulty walking, decreased ROM, decreased strength, increased muscle spasms, impaired flexibility, prosthetic dependency , and pain.   ACTIVITY LIMITATIONS: bending, squatting, stairs, and transfers  PARTICIPATION LIMITATIONS: interpersonal relationship and community activity  PERSONAL FACTORS: Age and 1-2 comorbidities: HTN; Hx of cancer  are also affecting patient's functional outcome.   REHAB POTENTIAL: Good  CLINICAL DECISION MAKING: Stable/uncomplicated  EVALUATION COMPLEXITY: Low   GOALS: Goals reviewed with patient? Yes  SHORT TERM GOALS: Target date: 01/31/2023  Patient will be independent with initial HEP. Baseline:  Goal status: MET 02/16/2023  2.  Patient will report > or = to 30% improvement in balance & back pain since starting PT. Baseline:  Goal status: MET 02/16/2023  3 Patient will score < or = to 11 sec on 5 STS for improved functional mobility. Baseline: 14.59 sec Goal status: MET 02/09/2023   LONG TERM GOALS: Target date: 02/28/2023  Patient will demonstrate independence in advanced HEP. Baseline:  Goal status: IN PROGRESS 02/23/2023  2.  Patient will report > or = to 70% improvement in balance & back pain since starting PT. Baseline:  Goal status: IN PROGRESS 02/16/2023   3.  Patient will score < or = to 12 sec on TUG for decreased falls risk. Baseline: 13.52  sec Goal status: MET 02/09/2023  4.Patient will score > or = to 50% on ABC scale for improved level of physical functioning.  Baseline: 40% Goal status: MET 02/16/2023  5.  Patient will be able to ascend  stairs in a reciprocal pattern with UE support. Baseline:  Goal status: IN PROGRESS 02/23/2023  6.  Patient will ambulate > or = to 550 ft on for improved community ambulation.  Baseline:  Goal status: MET 02/16/2023  PLAN:  PT FREQUENCY: 2x/week  PT DURATION: 8 weeks  PLANNED INTERVENTIONS: 97164- PT Re-evaluation, 97110-Therapeutic exercises, 97530- Therapeutic activity, 97112- Neuromuscular re-education, 97535- Self Care, 16109- Manual therapy, [id number]- Gait training, [phone]- Canalith repositioning, [id number]- Aquatic Therapy, 97014- Electrical stimulation (unattended), [id number]- Electrical stimulation (manual), [id number]- Vasopneumatic device, [id number]- Ultrasound, [id number]- Traction (mechanical), [id number]- Ionotophoresis 4mg /ml Dexamethasone, Patient/Family education, Balance training, Stair training, Taping, Dry Needling, Joint mobilization, Joint manipulation, Spinal manipulation, Spinal mobilization, Vestibular training, Cryotherapy, and Moist heat.  PLAN FOR NEXT SESSION: assess goals & discharge patient  Claude Manges, PT 02/23/23 1:16 PM

## 2023-02-27 ENCOUNTER — Institutional Professional Consult (permissible substitution): Payer: Medicare Other | Admitting: Pulmonary Disease

## 2023-02-28 ENCOUNTER — Ambulatory Visit: Payer: Medicare Other | Attending: Family Medicine | Admitting: Physical Therapy

## 2023-02-28 ENCOUNTER — Encounter: Payer: Self-pay | Admitting: Physical Therapy

## 2023-02-28 DIAGNOSIS — M5459 Other low back pain: Secondary | ICD-10-CM | POA: Insufficient documentation

## 2023-02-28 DIAGNOSIS — R293 Abnormal posture: Secondary | ICD-10-CM | POA: Insufficient documentation

## 2023-02-28 DIAGNOSIS — R2689 Other abnormalities of gait and mobility: Secondary | ICD-10-CM | POA: Diagnosis present

## 2023-02-28 DIAGNOSIS — M6281 Muscle weakness (generalized): Secondary | ICD-10-CM | POA: Diagnosis present

## 2023-02-28 NOTE — Therapy (Signed)
 OUTPATIENT PHYSICAL THERAPY THORACOLUMBAR & BALANCE TREATMENT / DISCHARGE NOTE   Patient Name: [name] MRN: [id number] DOB:02/27/1945, 78 y.o., male Today's Date: 02/28/2023    END OF SESSION:  PT End of Session - 02/28/23 1353     Visit Number 11    Date for PT Re-Evaluation 02/28/23    Authorization Type Medicare/ T[employer]are    Progress Note Due on Visit 20    PT Start Time 1228    PT Stop Time 1310    PT Time Calculation (min) 42 min    Activity Tolerance Patient tolerated treatment well    Behavior During Therapy [facility] for tasks assessed/performed                      Past Medical History:  Diagnosis Date   Cancer (HCC)    prostate   Erectile dysfunction    GERD without esophagitis    History of kidney stones    Hyperlipidemia    Hypertension    Irritable bowel    PAD (peripheral artery disease) (HCC)    Pneumonia    Rhinitis    Past Surgical History:  Procedure Laterality Date   both feet bunion surgery 2001     left hip unipolar hemiarthroplasty  1980   with revision 2013 with [name]   nasal surgery yrs ago     PROSTATECTOMY  2006   TOTAL HIP ARTHROPLASTY Right 07/11/2018   Procedure: TOTAL HIP ARTHROPLASTY ANTERIOR APPROACH;  Surgeon: Melodi Lerner, MD;  Location: [facility];  Service: Orthopedics;  Laterality: Right;  100 mins/ to follow 1st case   Patient Active Problem List   Diagnosis Date Noted   OA (osteoarthritis) of hip 07/11/2018   Chronic cough 07/01/2013   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    PCP: Gladystine Erminio CROME, MD  REFERRING PROVIDER: Gladystine Erminio CROME, MD  REFERRING DIAG: R26.89 (ICD-10-CM) - Other abnormalities of gait and mobility M41.9 (ICD-10-CM) - Scoliosis, unspecified  Rationale for Evaluation and Treatment: Rehabilitation  THERAPY DIAG:  Other abnormalities of gait and mobility  Other low back pain  Abnormal posture  Muscle weakness (generalized)  ONSET DATE: December 01, 2022  SUBJECTIVE:                                                                                                                                                                                           SUBJECTIVE STATEMENT: Patient reports he is doing good today. No new complaints.  From Eval: Patient fell December 01, 2022 and he went to the ED because his back was hurting. He fell walking to  the bathroom at night and he did not have any lights one. He tripped over his rug. They did a CT scan at the ER and everything was fine. Since his fall he has been having a lot of coughing attacks and he feels that it has hurt his back. He feels his balance is horrible and his endurance is very poor. His back pain has improved and he want to focus more on his balance. He feels off balance when he is walking, walking and having to turn his body, and bending over.   PERTINENT HISTORY:  HTN; Hx prostate cancer; bilateral hip arthroplasty 2013, 2020;   PAIN: 02/21/2023 Are you having pain? Yes: NPRS scale: 0/10 Pain location: achy Pain description: Rt side of low back Aggravating factors: coughing Relieving factors: lidocaine  patches  PRECAUTIONS: Fall  RED FLAGS: None   WEIGHT BEARING RESTRICTIONS: No  FALLS:  Has patient fallen in last 6 months? Yes. Number of falls 1. See above. PT to address balance deficits   LIVING ENVIRONMENT: Lives with: lives with their family Lives in: House/apartment Stairs: Yes: Internal: 12 steps; on right going up Has following equipment at home: Single point cane he uses the cane when he has to do a lot of walking  OCCUPATION: Retired  PLOF: Independent Leisure: watching baseball  PATIENT GOALS: To improve my balance  NEXT MD VISIT: 4 months  OBJECTIVE:  Note: Objective measures were completed at Evaluation unless otherwise noted.  DIAGNOSTIC FINDINGS:  IMPRESSION: CT thoracic spine 12/02/2022 1. No evidence of acute thoracic spine fracture,  traumatic subluxation or static signs of instability. 2. Old fractures of the left 11th rib posteriorly and right 7th rib laterally. 3. Chronic lung disease with progressive scarring and subpleural reticulation bilaterally. 4.  Aortic Atherosclerosis (ICD10-I70.0).  PATIENT SURVEYS:  ABC scale 40.62% low level of physical functioning 02/16/2023 : 58% SCREENING FOR RED FLAGS: Bowel or bladder incontinence: No Spinal tumors: No Cauda equina syndrome: No Compression fracture: No Abdominal aneurysm: No  COGNITION: Overall cognitive status: Within functional limits for tasks assessed     SENSATION: WFL  MUSCLE LENGTH: Hamstrings: decreased bilateral   POSTURE: rounded shoulders and forward head  PALPATION: Increased muscle spasm of lumbar paraspinals   LUMBAR ROM: * pain  AROM eval  Flexion Bends to shins  Extension 30% limited  Right lateral flexion Bends above knee joint line *  Left lateral flexion Bends above knee joint line *  Right rotation 30 % limited *  Left rotation WFL   (Blank rows = not tested)  LOWER EXTREMITY ROM:  WFL   LOWER EXTREMITY MMT:    MMT Right eval Left eval  Hip flexion 4 4  Hip extension    Hip abduction 4 4  Hip adduction    Hip internal rotation    Hip external rotation    Knee flexion 4+ 4+  Knee extension 4+ 4+  Ankle dorsiflexion    Ankle plantarflexion    Ankle inversion    Ankle eversion     (Blank rows = not tested)    FUNCTIONAL TESTS:  5 times sit to stand: 14.59 sec no UE support Timed up and go (TUG): 13.52 sec 3 minute walk test: 447 ft ; increased fatigue after 1.5 minutes; 2 minute mark legs were tired 01/05/2023 SPPB:6 /12 Short Physical Performance Battery:    Balance: total 2       Side by side stance:    points (1)  Semi -tandem:  points (0)  Tandem:      points (1)    Gait Speed: (35m=9.84 feet) done 2x take the best time:    2  points                                                                     Repeated Chair Stands:   2   points   (timer stopped when straight on 5th stand)                                              Total score=    6  points <10/12 predictive of 1 or more mobility limitations and increased risk of mobility disability 6 or less/12 associated with a higher fall rate  DGI: 14/24  02/09/2023 5STS: 9.22 sec no UE support TUG:9.44sec  02/19/2023 : 573 FT; legs were tired at the end   GAIT: Distance walked: 428ft Assistive device utilized: None Level of assistance: Complete Independence Comments: Wide BOS; patient caught Rt foot while walking once; symmetrical step length; decreased cadence  TODAY'S TREATMENT:     DATE:  02/28/2023: NuStep Level 5 7 mins- PT present to discuss status Stair Negotiation- reciprocal pattern unilateral UE support Single leg on airex shoulder rows & extension with blue TB 2 x 10  Single leg on airex cone taps (cones forwards, sideways, back wards) unilateral had support on barre x 8 each Airex step ups hold 5# DB x 10 each leg Lateral airex step ups holding 5# DB x 10 bilateral  Leg Press seat 5 65# 2 x 10 bilateral ; single leg 40#  x 10 each Single leg on airex + bicep curls 2 x 10 4# DB    02/23/2023: NuStep Level 5 7 mins- PT present to discuss status Discussion of ending POC & updating HEP Sit to Stand + Chest Press 5# DB 2 x 10 Diagonal (ear to hip) on airex 2# DB x 10 each direction Airex step ups hold 5# DB x 10 each leg Lateral airex step up x 10 each direction Shoulder press 5# x 10 bilateral  Leg Press seat 5 65# 2 x 10 bilateral ; single leg 40# 2 x 10 each   02/21/2023: NuStep Level 4 7 mins- PT present to discuss status Standing shoulder rows & extension with green TB modified tandem stance 2 x 10 Sit to Stand + Chest Press 4# DB 2 x 10 Diagonal (ear to hip) on airex 2# DB x 10 each direction Airex step ups hold 4# DB x 10 each leg Resisted walking 5# backwards; 5# sideways x 4 each directon Leg  Press seat 5 70# 2 x 10 bilateral ; single leg 35# x 10 each Seated chest press 4# 2 x8  Seated trunk extension with long magenta loop 2 x 10  Farmer's carry 4# x 2 laps   02/16/2023: NuStep Level 4 7 mins- PT present to discuss status Lt quad stretch on chair 2 x 30 sec Hamstring stretch on step  x 30 sec bilateral  3 MWT: 573 FT; legs were tired at the end ABC Scale:58% Standing shoulder rows &  extension with green TB modified tandem stance 2 x 10 Sit to Stand + Chest Press 4# DB 2 x 10 Punches on airex 2# DB x 20 Airex step ups hold 2# DB x 10 each leg Modified tandem on airex taping sticky note numbers on wall 2x 20-30sec  Leg Press seat 5 65# 2 x 10 bilateral ; single leg 30# x 10 each                                                                                                                    PATIENT EDUCATION:  Education details: Balance systems; POC; HEP Person educated: Patient Education method: Explanation, Demonstration, and Handouts Education comprehension: verbalized understanding, returned demonstration, and needs further education  HOME EXERCISE PROGRAM: Access Code: [facility] URL: [email or web address] Date: 02/28/2023 Prepared by: Kristeen Sar  Exercises - Supine Lower Trunk Rotation  - 1-2 x daily - 7 x weekly - 1 sets - 10 reps - 4-5 hold - Heel Raises with Counter Support  - 1-2 x daily - 7 x weekly - 1 sets - 10 reps - Sit to Stand  - 1-2 x daily - 7 x weekly - 1 sets - 5 reps - Seated Hamstring Stretch  - 1-2 x daily - 7 x weekly - 2 sets - 20-30 hold - Standing Shoulder Row with Anchored Resistance  - 1 x daily - 7 x weekly - 2 sets - 10 reps - Shoulder extension with resistance - Neutral  - 1 x daily - 7 x weekly - 2 sets - 10 reps - Step Up  - 1 x daily - 7 x weekly - 2 sets - 10 reps - Tandem Stance in Corner  - 1 x daily - 7 x weekly - 2 sets - 30 hold - Semi-Tandem Corner Balance With Eyes Open  - 1 x daily - 7 x weekly - 2 sets -  30 hold - Standing Bicep Curls Supinated with Dumbbells  - 1 x daily - 7 x weekly - 3 sets - 10 reps - Sit to Stand with Weighted Bag  - 1 x daily - 7 x weekly - 2 sets - 10 reps - Standing Balance with Diagonal Ball Lift  - 1 x daily - 7 x weekly - 1 sets - 10 reps - Shoulder Overhead Press in Flexion with Dumbbells  - 1 x daily - 7 x weekly - 2 sets - 10 reps - Forward Step Up with Unilateral Counter Support  - 1 x daily - 7 x weekly - 2 sets - 10 reps - Lateral Step Up with Unilateral Counter Support  - 1 x daily - 7 x weekly - 2 sets - 10 reps  ASSESSMENT:  CLINICAL IMPRESSION: Arkin has made great progress with physical therapy. He has met all his goals at this time and he verbalized feeling 70-80% better since starting therapy. He has been compliant with HEP and he has purchase dumbbells and an airex pad to continue his progress made  with physical therapy. Reviewed HEP exercises with patient and added exercises he can perform with airex. Patient to discharge home with HEP.      OBJECTIVE IMPAIRMENTS: Abnormal gait, decreased activity tolerance, decreased balance, decreased endurance, difficulty walking, decreased ROM, decreased strength, increased muscle spasms, impaired flexibility, prosthetic dependency , and pain.   ACTIVITY LIMITATIONS: bending, squatting, stairs, and transfers  PARTICIPATION LIMITATIONS: interpersonal relationship and community activity  PERSONAL FACTORS: Age and 1-2 comorbidities: HTN; Hx of cancer  are also affecting patient's functional outcome.   REHAB POTENTIAL: Good  CLINICAL DECISION MAKING: Stable/uncomplicated  EVALUATION COMPLEXITY: Low   GOALS: Goals reviewed with patient? Yes  SHORT TERM GOALS: Target date: 01/31/2023  Patient will be independent with initial HEP. Baseline:  Goal status: MET 02/16/2023  2.  Patient will report > or = to 30% improvement in balance & back pain since starting PT. Baseline:  Goal status: MET 02/16/2023  3  Patient will score < or = to 11 sec on 5 STS for improved functional mobility. Baseline: 14.59 sec Goal status: MET 02/09/2023   LONG TERM GOALS: Target date: 02/28/2023  Patient will demonstrate independence in advanced HEP. Baseline:  Goal status: MET 02/28/2023  2.  Patient will report > or = to 70% improvement in balance & back pain since starting PT. Baseline:  Goal status: MET 02/28/2023   3.  Patient will score < or = to 12 sec on TUG for decreased falls risk. Baseline: 13.52 sec Goal status: MET 02/09/2023  4.Patient will score > or = to 50% on ABC scale for improved level of physical functioning.  Baseline: 40% Goal status: MET 02/16/2023  5.  Patient will be able to ascend stairs in a reciprocal pattern with UE support. Baseline:  Goal status: MET 02/28/2023  6.  Patient will ambulate > or = to 550 ft on for improved community ambulation.  Baseline:  Goal status: MET 02/16/2023  PLAN:  PT FREQUENCY: 2x/week  PT DURATION: 8 weeks  PLANNED INTERVENTIONS: [id number]- PT Re-evaluation, 97110-Therapeutic exercises, 97530- Therapeutic activity, 97112- Neuromuscular re-education, 97535- Self Care, [id number]- Manual therapy, [id number]- Gait training, [phone]- Canalith repositioning, [id number]- Aquatic Therapy, 97014- Electrical stimulation (unattended), [id number]- Electrical stimulation (manual), [id number]- Vasopneumatic device, [id number]- Ultrasound, [id number]- Traction (mechanical), [id number]- Ionotophoresis 4mg /ml Dexamethasone , Patient/Family education, Balance training, Stair training, Taping, Dry Needling, Joint mobilization, Joint manipulation, Spinal manipulation, Spinal mobilization, Vestibular training, Cryotherapy, and Moist heat.  PLAN FOR NEXT SESSION: patient to discharge home from HEP   PHYSICAL THERAPY DISCHARGE SUMMARY  Visits from Start of Care: 11  Current functional level related to goals / functional outcomes: See above   Remaining deficits: None   Education / Equipment: See above    Patient agrees to discharge. Patient goals were met. Patient is being discharged due to meeting the stated rehab goals.  Kristeen Sar, PT 02/28/23 1:54 PM
